# Patient Record
Sex: Female | Born: 1945
Health system: Southern US, Community
[De-identification: ages and names within clinical notes are randomized; demographics above are authoritative.]

## PROBLEM LIST (undated history)

## (undated) DIAGNOSIS — H409 Unspecified glaucoma: Secondary | ICD-10-CM

## (undated) DIAGNOSIS — T8859XA Other complications of anesthesia, initial encounter: Secondary | ICD-10-CM

## (undated) DIAGNOSIS — J449 Chronic obstructive pulmonary disease, unspecified: Secondary | ICD-10-CM

## (undated) DIAGNOSIS — J45909 Unspecified asthma, uncomplicated: Secondary | ICD-10-CM

## (undated) DIAGNOSIS — I10 Essential (primary) hypertension: Secondary | ICD-10-CM

## (undated) DIAGNOSIS — M1990 Unspecified osteoarthritis, unspecified site: Secondary | ICD-10-CM

## (undated) DIAGNOSIS — K760 Fatty (change of) liver, not elsewhere classified: Secondary | ICD-10-CM

## (undated) DIAGNOSIS — R011 Cardiac murmur, unspecified: Secondary | ICD-10-CM

## (undated) DIAGNOSIS — R0600 Dyspnea, unspecified: Secondary | ICD-10-CM

## (undated) HISTORY — PX: THORACOTOMY: SUR1349

## (undated) HISTORY — DX: Unspecified glaucoma: H40.9

## (undated) HISTORY — PX: OTHER SURGICAL HISTORY: SHX169

## (undated) HISTORY — PX: CHOLECYSTECTOMY: SHX55

## (undated) HISTORY — PX: DILATION AND CURETTAGE OF UTERUS: SHX78

---

## 1998-04-27 DIAGNOSIS — Z8614 Personal history of Methicillin resistant Staphylococcus aureus infection: Secondary | ICD-10-CM

## 1998-04-27 HISTORY — DX: Personal history of Methicillin resistant Staphylococcus aureus infection: Z86.14

## 2003-09-21 ENCOUNTER — Other Ambulatory Visit: Payer: Self-pay

## 2004-10-17 ENCOUNTER — Ambulatory Visit: Payer: Self-pay | Admitting: Internal Medicine

## 2005-05-14 ENCOUNTER — Ambulatory Visit: Payer: Self-pay | Admitting: Chiropractic Medicine

## 2005-09-02 ENCOUNTER — Other Ambulatory Visit: Payer: Self-pay

## 2005-09-08 ENCOUNTER — Ambulatory Visit: Payer: Self-pay | Admitting: Orthopaedic Surgery

## 2005-12-23 ENCOUNTER — Ambulatory Visit: Payer: Self-pay | Admitting: Orthopaedic Surgery

## 2006-02-03 ENCOUNTER — Ambulatory Visit: Payer: Self-pay | Admitting: Internal Medicine

## 2007-01-24 ENCOUNTER — Ambulatory Visit: Payer: Self-pay | Admitting: Internal Medicine

## 2008-03-15 ENCOUNTER — Ambulatory Visit: Payer: Self-pay | Admitting: Internal Medicine

## 2009-03-20 ENCOUNTER — Ambulatory Visit: Payer: Self-pay | Admitting: Internal Medicine

## 2010-03-21 ENCOUNTER — Ambulatory Visit: Payer: Self-pay | Admitting: Internal Medicine

## 2010-10-28 ENCOUNTER — Ambulatory Visit: Payer: Self-pay | Admitting: Rheumatology

## 2011-07-28 ENCOUNTER — Ambulatory Visit: Payer: Self-pay | Admitting: Internal Medicine

## 2011-08-05 ENCOUNTER — Ambulatory Visit: Payer: Self-pay | Admitting: Internal Medicine

## 2011-08-27 ENCOUNTER — Ambulatory Visit: Payer: Self-pay | Admitting: Specialist

## 2011-09-03 ENCOUNTER — Ambulatory Visit: Payer: Self-pay | Admitting: Cardiothoracic Surgery

## 2011-09-26 ENCOUNTER — Ambulatory Visit: Payer: Self-pay | Admitting: Cardiothoracic Surgery

## 2011-10-15 LAB — CREATININE, SERUM
Creatinine: 0.81 mg/dL (ref 0.60–1.30)
EGFR (African American): >60
EGFR (Non-African Amer.): >60

## 2011-10-26 ENCOUNTER — Ambulatory Visit: Payer: Self-pay | Admitting: Cardiothoracic Surgery

## 2012-01-14 ENCOUNTER — Ambulatory Visit: Payer: Self-pay | Admitting: Cardiothoracic Surgery

## 2012-01-26 ENCOUNTER — Ambulatory Visit: Payer: Self-pay | Admitting: Cardiothoracic Surgery

## 2012-02-01 ENCOUNTER — Ambulatory Visit: Payer: Self-pay | Admitting: Obstetrics and Gynecology

## 2012-02-01 LAB — BASIC METABOLIC PANEL
Anion Gap: 7 (ref 7–16)
Calcium, Total: 9.7 mg/dL (ref 8.5–10.1)
Co2: 30 mmol/L (ref 21–32)
Creatinine: 0.7 mg/dL (ref 0.60–1.30)
EGFR (African American): >60
Potassium: 3.4 mmol/L — ABNORMAL LOW (ref 3.5–5.1)
Sodium: 141 mmol/L (ref 136–145)

## 2012-02-12 ENCOUNTER — Ambulatory Visit: Payer: Self-pay | Admitting: Obstetrics and Gynecology

## 2012-04-11 ENCOUNTER — Ambulatory Visit: Payer: Self-pay | Admitting: Cardiothoracic Surgery

## 2012-04-12 ENCOUNTER — Ambulatory Visit: Payer: Self-pay | Admitting: Cardiothoracic Surgery

## 2012-05-05 ENCOUNTER — Ambulatory Visit: Payer: Self-pay | Admitting: Cardiothoracic Surgery

## 2012-05-17 LAB — CBC WITH DIFFERENTIAL/PLATELET
Eosinophil %: 1.9 %
HCT: 45.7 % (ref 35.0–47.0)
HGB: 15.4 g/dL (ref 12.0–16.0)
Lymphocyte #: 2.9 10*3/uL (ref 1.0–3.6)
Lymphocyte %: 30.2 %
MCH: 30.3 pg (ref 26.0–34.0)
MCHC: 33.7 g/dL (ref 32.0–36.0)
MCV: 90 fL (ref 80–100)
Monocyte #: 0.6 x10 3/mm (ref 0.2–0.9)
Monocyte %: 6.1 %
Neutrophil #: 5.8 10*3/uL (ref 1.4–6.5)
Neutrophil %: 61 %
Platelet: 253 10*3/uL (ref 150–440)
RBC: 5.09 10*6/uL (ref 3.80–5.20)
RDW: 14.1 % (ref 11.5–14.5)
WBC: 9.5 10*3/uL (ref 3.6–11.0)

## 2012-05-17 LAB — APTT: Activated PTT: 27.3 secs (ref 23.6–35.9)

## 2012-05-17 LAB — COMPREHENSIVE METABOLIC PANEL
Albumin: 3.9 g/dL (ref 3.4–5.0)
Alkaline Phosphatase: 75 U/L (ref 50–136)
Anion Gap: 5 — ABNORMAL LOW (ref 7–16)
Bilirubin,Total: 0.3 mg/dL (ref 0.2–1.0)
Creatinine: 1.1 mg/dL (ref 0.60–1.30)
EGFR (African American): >60
Osmolality: 281 (ref 275–301)
Potassium: 3.4 mmol/L — ABNORMAL LOW (ref 3.5–5.1)
SGPT (ALT): 54 U/L (ref 12–78)
Sodium: 140 mmol/L (ref 136–145)
Total Protein: 7.1 g/dL (ref 6.4–8.2)

## 2012-05-17 LAB — PROTIME-INR: INR: 1

## 2012-05-23 ENCOUNTER — Ambulatory Visit: Payer: Self-pay

## 2012-05-23 DIAGNOSIS — R0602 Shortness of breath: Secondary | ICD-10-CM

## 2012-05-30 ENCOUNTER — Inpatient Hospital Stay: Payer: Self-pay

## 2012-05-31 LAB — CBC WITH DIFFERENTIAL/PLATELET
Basophil #: 0 10*3/uL (ref 0.0–0.1)
Eosinophil %: 0.1 %
HCT: 39.1 % (ref 35.0–47.0)
Lymphocyte #: 1.7 10*3/uL (ref 1.0–3.6)
MCH: 30.4 pg (ref 26.0–34.0)
Monocyte #: 1.7 x10 3/mm — ABNORMAL HIGH (ref 0.2–0.9)
Monocyte %: 9.6 %
Neutrophil #: 13.9 10*3/uL — ABNORMAL HIGH (ref 1.4–6.5)
Neutrophil %: 80.1 %
Platelet: 232 10*3/uL (ref 150–440)
RBC: 4.25 10*6/uL (ref 3.80–5.20)
RDW: 13.8 % (ref 11.5–14.5)
WBC: 17.4 10*3/uL — ABNORMAL HIGH (ref 3.6–11.0)

## 2012-05-31 LAB — BASIC METABOLIC PANEL
Anion Gap: 6 — ABNORMAL LOW (ref 7–16)
BUN: 12 mg/dL (ref 7–18)
Chloride: 110 mmol/L — ABNORMAL HIGH (ref 98–107)
Co2: 25 mmol/L (ref 21–32)
Glucose: 150 mg/dL — ABNORMAL HIGH (ref 65–99)
Osmolality: 284 (ref 275–301)
Potassium: 4.2 mmol/L (ref 3.5–5.1)
Sodium: 141 mmol/L (ref 136–145)

## 2012-06-01 LAB — CBC WITH DIFFERENTIAL/PLATELET
Eosinophil #: 0.2 10*3/uL (ref 0.0–0.7)
Lymphocyte #: 2.2 10*3/uL (ref 1.0–3.6)
MCH: 30 pg (ref 26.0–34.0)
MCHC: 32.7 g/dL (ref 32.0–36.0)
Monocyte #: 1.3 x10 3/mm — ABNORMAL HIGH (ref 0.2–0.9)
Monocyte %: 9.4 %
Neutrophil #: 10.5 10*3/uL — ABNORMAL HIGH (ref 1.4–6.5)
RBC: 4.63 10*6/uL (ref 3.80–5.20)
RDW: 13.9 % (ref 11.5–14.5)

## 2012-06-01 LAB — BASIC METABOLIC PANEL
BUN: 12 mg/dL (ref 7–18)
Calcium, Total: 8.5 mg/dL (ref 8.5–10.1)
Co2: 28 mmol/L (ref 21–32)
Creatinine: 0.77 mg/dL (ref 0.60–1.30)
EGFR (Non-African Amer.): >60
Glucose: 122 mg/dL — ABNORMAL HIGH (ref 65–99)
Osmolality: 275 (ref 275–301)
Potassium: 3.8 mmol/L (ref 3.5–5.1)

## 2012-06-04 ENCOUNTER — Ambulatory Visit: Payer: Self-pay | Admitting: Cardiothoracic Surgery

## 2012-06-25 ENCOUNTER — Ambulatory Visit: Payer: Self-pay | Admitting: Cardiothoracic Surgery

## 2012-07-11 ENCOUNTER — Ambulatory Visit: Payer: Self-pay | Admitting: Cardiothoracic Surgery

## 2012-12-06 ENCOUNTER — Ambulatory Visit: Payer: Self-pay | Admitting: Internal Medicine

## 2013-09-27 IMAGING — CR DG CHEST 1V PORT
1 series · 1 of 1 positions shown · non-contrast
Comparison: none

REASON FOR EXAM: assess for pleural effusion
COMMENTS:

[ap]
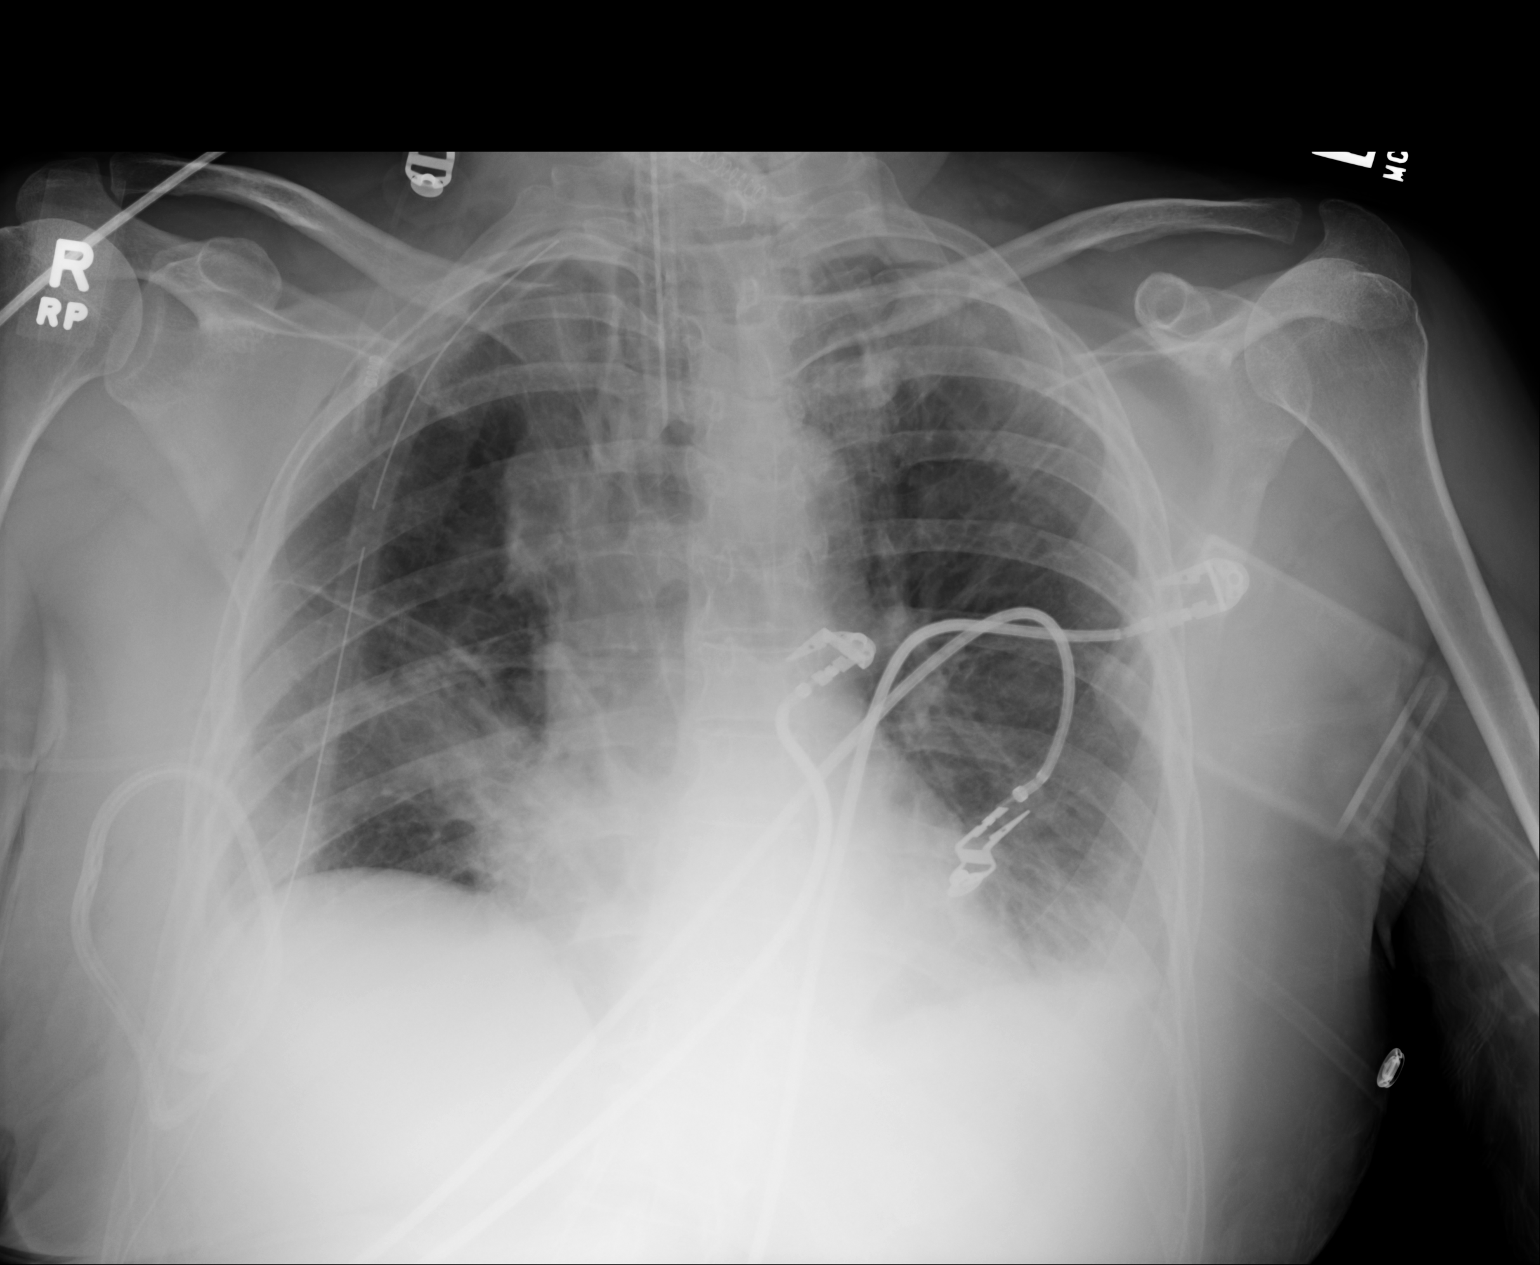

[1 of 1 positions shown; findings below may reference images not displayed]

PROCEDURE:     DXR - DXR PORTABLE CHEST SINGLE VIEW  - May 30, 2012 [DATE]

RESULT:     Comparison is made to study May 17, 2012.

There is obscuration of the left hemidiaphragm consistent with a small
amount of fluid and adjacent atelectasis or infiltrate. No large effusion is
demonstrated on the left nor on the right. No pneumothorax on the right is
demonstrated. There is an endotracheal tube in place whose tip lies
approximately 5 mm below the inferior margin of the clavicular heads. A
chest tube is in place on the right with the tip of the catheter in the
pulmonary apex. There is also a rounded tubular structure which may reflect
a small calibered chest tube is centered over the lateral costophrenic
gutter. There is atelectasis in the right middle lobe.
IMPRESSION: 1. No large pleural effusion is demonstrated. A small amount of fluid on the
left is suspected.
2. On the right there is minimal lobe atelectasis.
3. Positioning of the chest tubes and of the endotracheal tubes is as
described.

[REDACTED]

## 2013-09-29 ENCOUNTER — Ambulatory Visit: Payer: Self-pay | Admitting: Ophthalmology

## 2013-09-29 IMAGING — CR DG CHEST 1V PORT
1 series · 1 of 1 positions shown · non-contrast
Comparison: none

REASON FOR EXAM: temp
COMMENTS:

PROCEDURE:     DXR - DXR PORTABLE CHEST SINGLE VIEW  - June 01, 2012  [DATE]
RESULT:     Comparison: 05/30/2012

[ap]
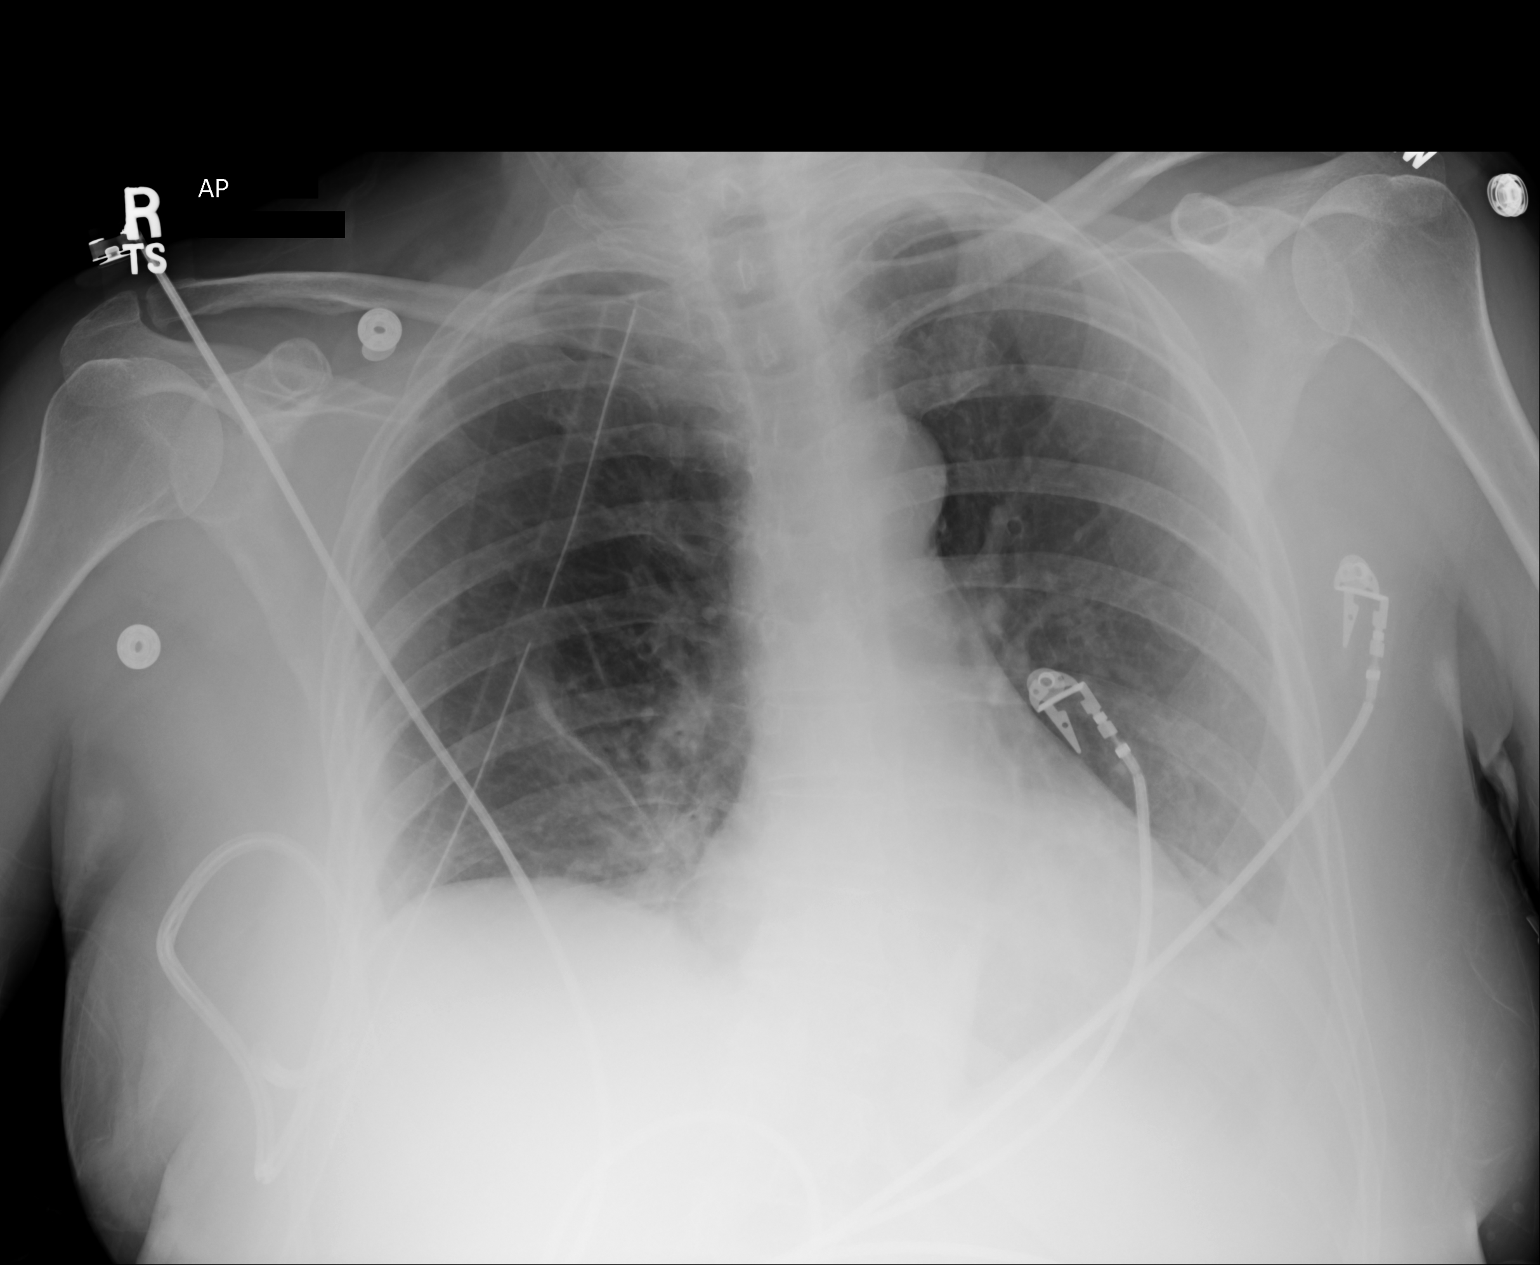

[1 of 1 positions shown; findings below may reference images not displayed]

FINDINGS: Single portable AP chest radiograph is provided. There is a right-sided
chest tube present in satisfactory position. There is no pneumothorax. There
is no right pleural effusion. There is a trace left pleural effusion. Stable
heart and mediastinum. The osseous structures are unremarkable.
IMPRESSION: Please see above.

[REDACTED]

## 2013-09-30 IMAGING — CR DG CHEST 2V
1 series · 2 of 2 positions shown · non-contrast
Comparison: none

REASON FOR EXAM: assess for pleural effusion
COMMENTS:

PROCEDURE:     DXR - DXR CHEST PA (OR AP) AND LATERAL  - June 02, 2012 [DATE]
RESULT:     Comparison: 06/02/2012

[Series 1: ap · 0.17mm/px · 2 of 2 slices shown]
[im 1/2]
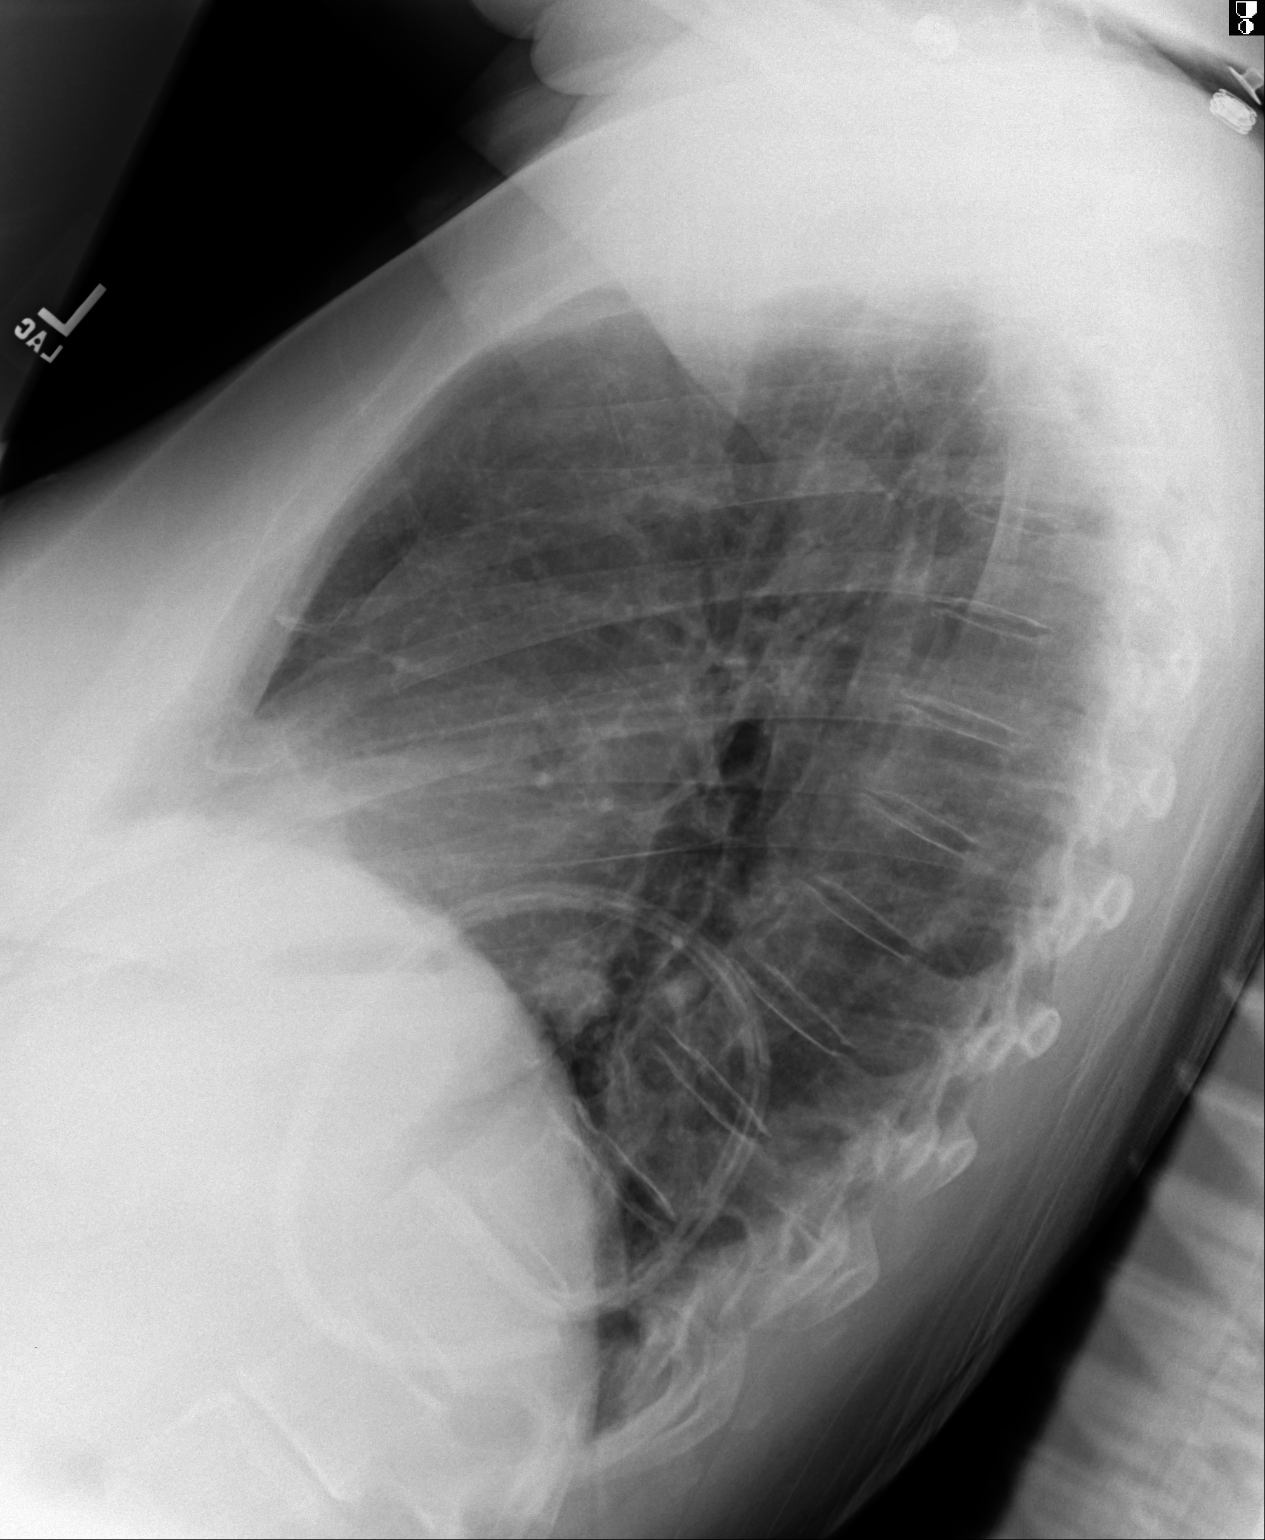
[im 2/2]
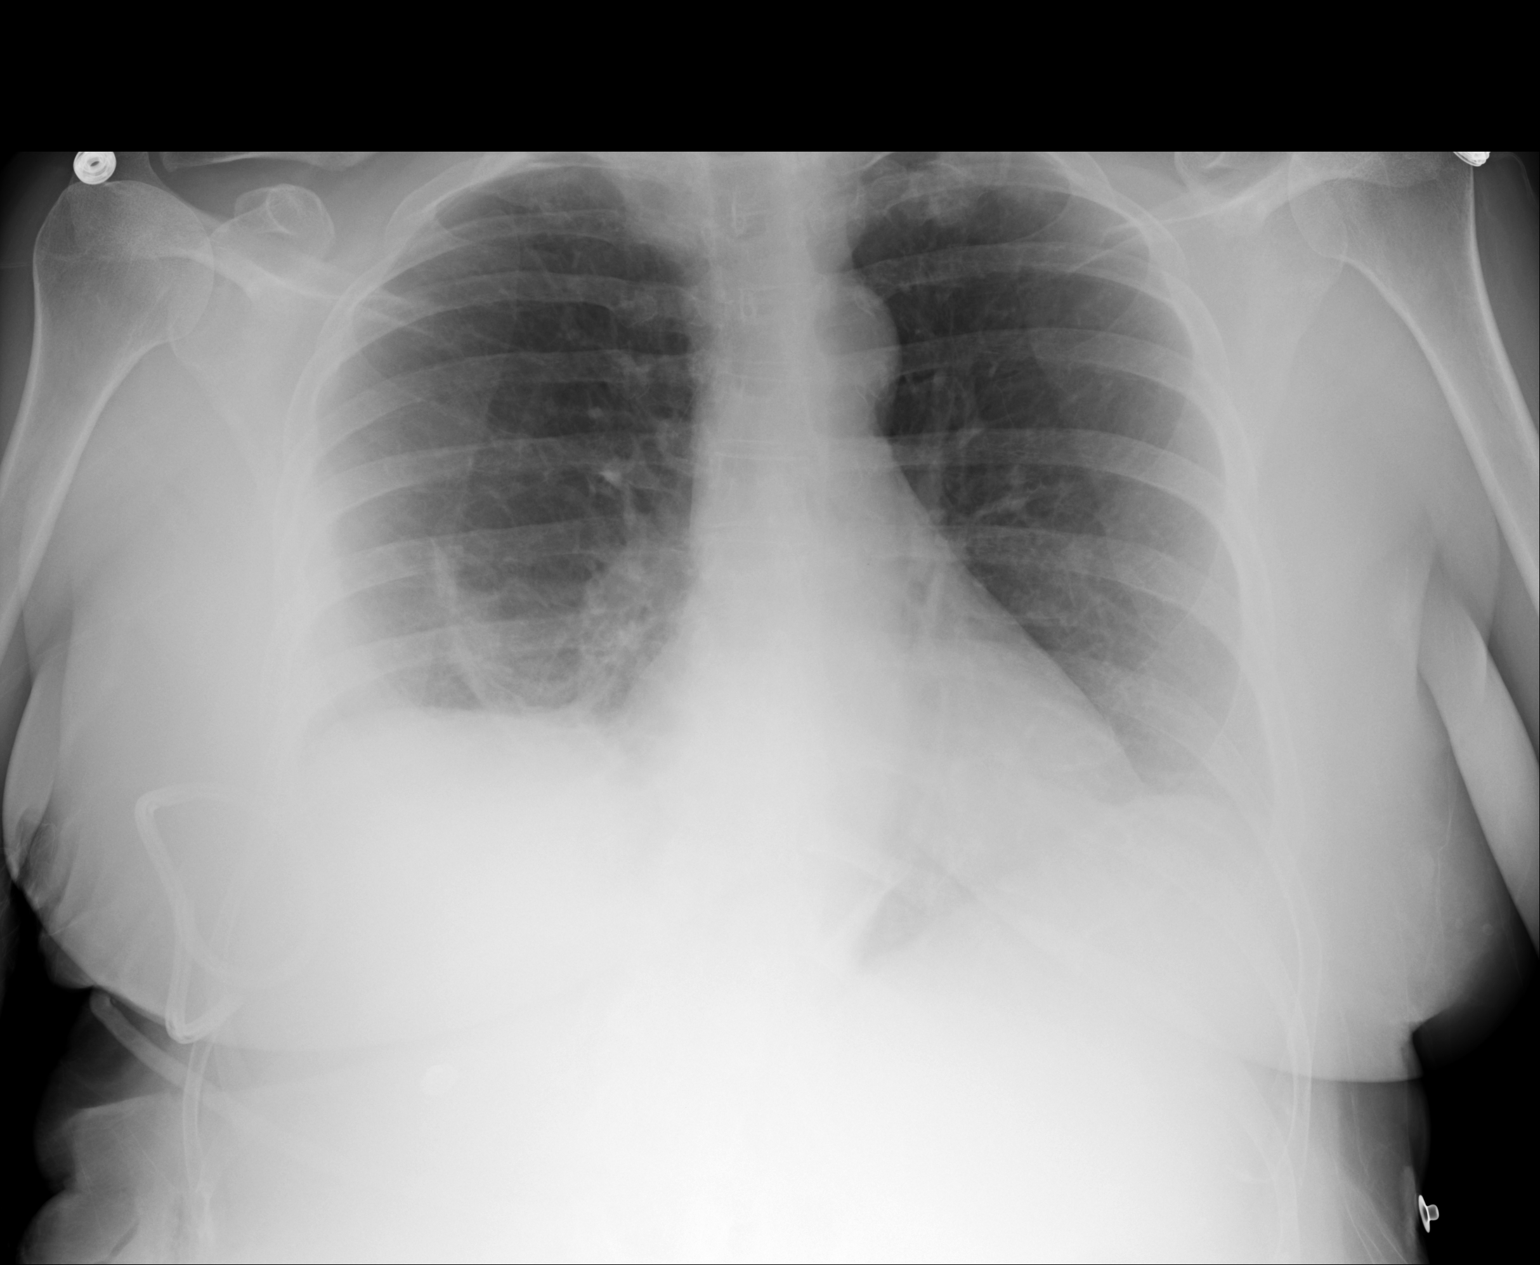

[2 of 2 positions shown; findings below may reference images not displayed]

FINDINGS: The heart and mediastinum are stable. The right chest tube has been removed.
Trace right apical pneumothorax is similar to prior. Mild right basilar
opacities are likely secondary to atelectasis.
IMPRESSION: Trace right pneumothorax is similar to prior, status post right chest tube
removal.

[REDACTED]

## 2013-09-30 IMAGING — CR DG CHEST 2V
1 series · 3 of 3 positions shown · non-contrast
Comparison: none

REASON FOR EXAM: assess for pleural effusion
COMMENTS:

PROCEDURE:     DXR - DXR CHEST PA (OR AP) AND LATERAL  - June 02, 2012  [DATE]
RESULT:     Comparison: 06/01/2012

[Series 1: x chest ap · 0.14mm/px · 3 of 3 slices shown]
[im 1/3]
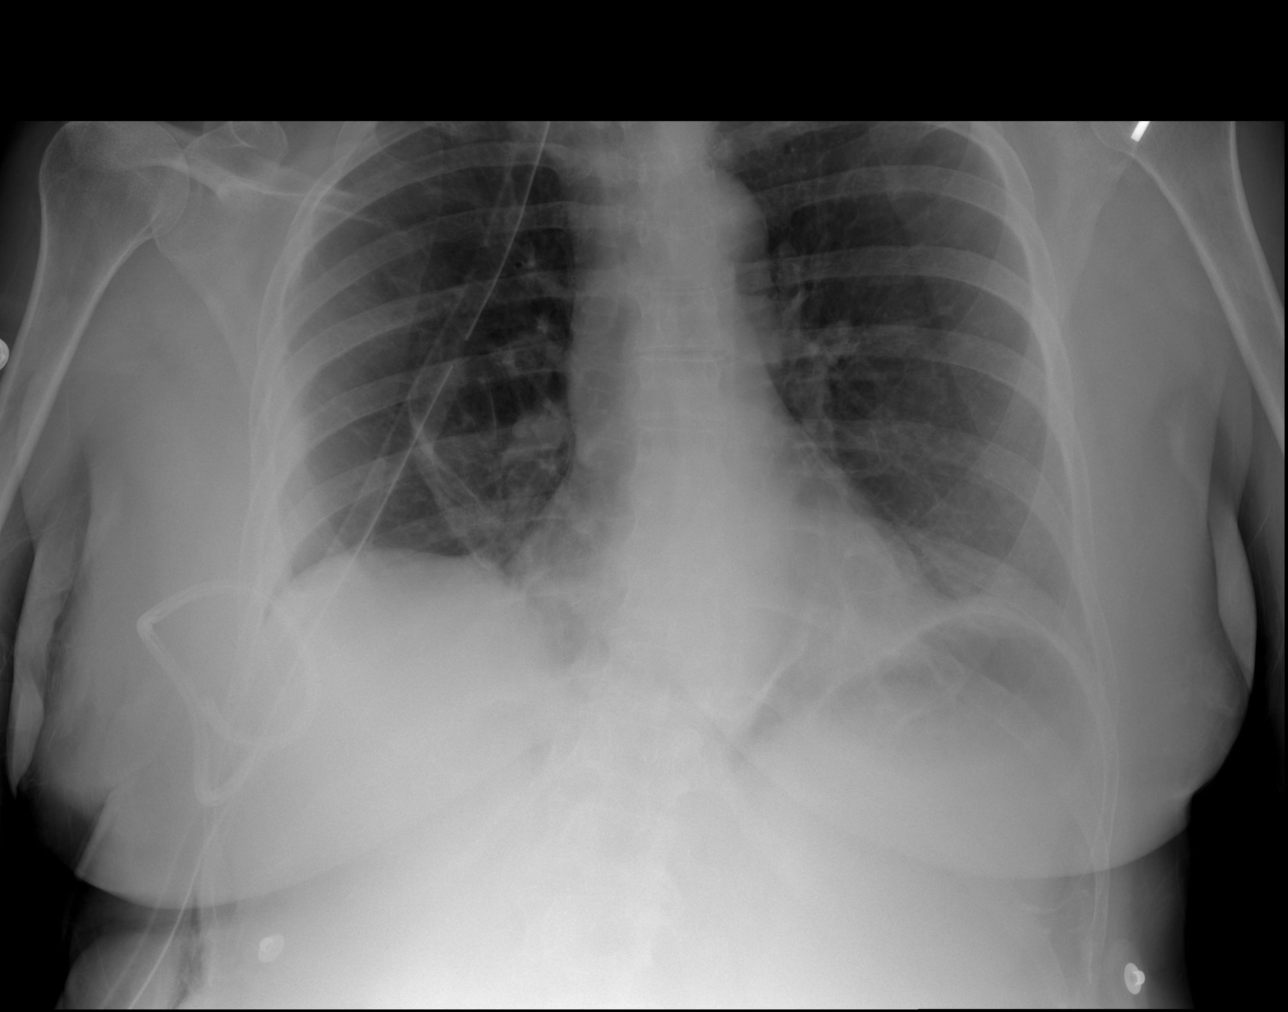
[im 2/3]
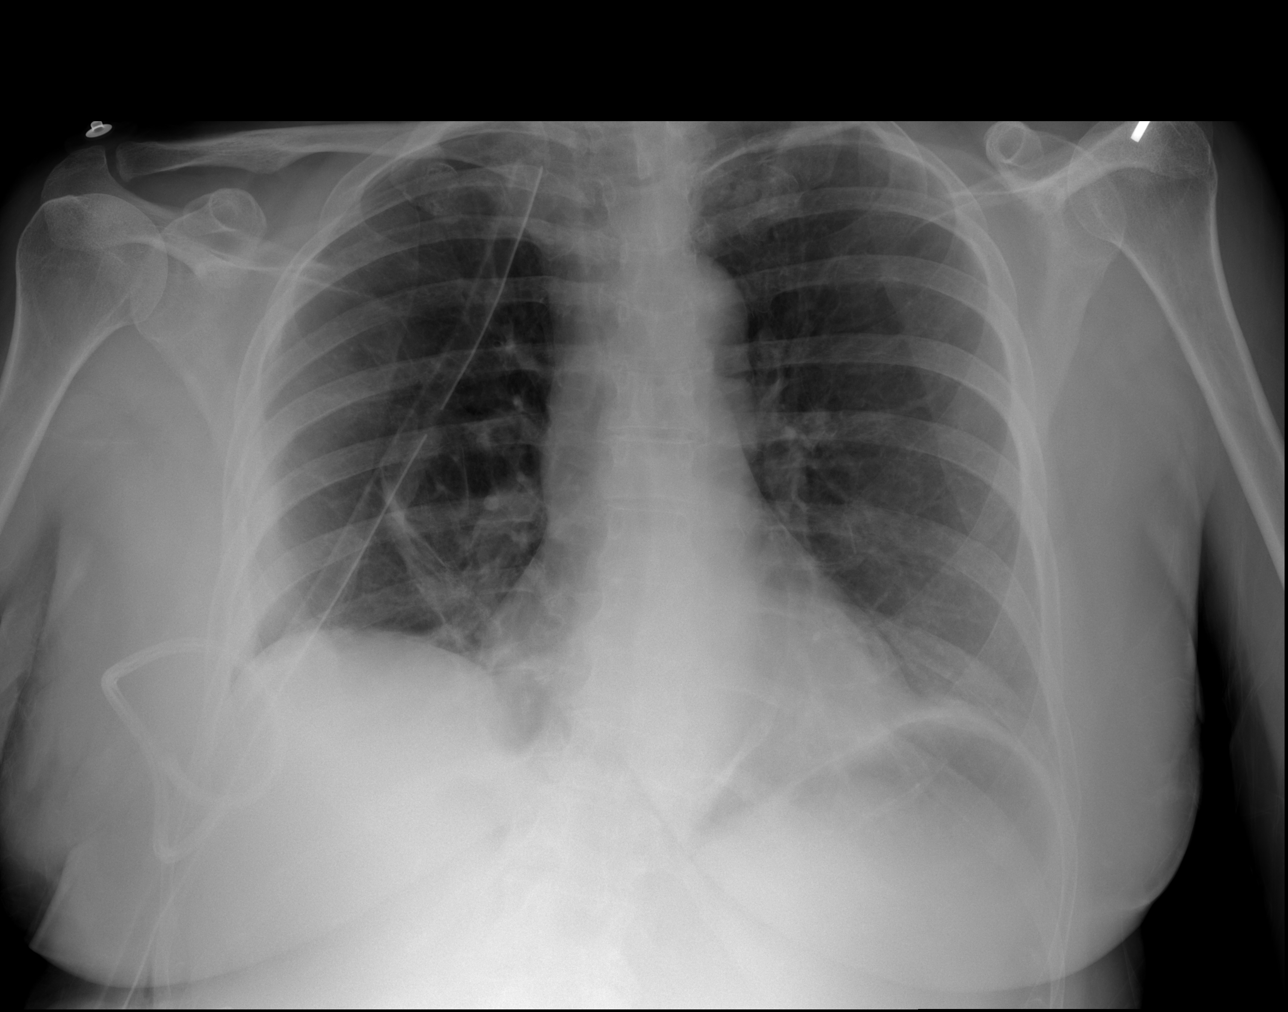
[im 3/3]
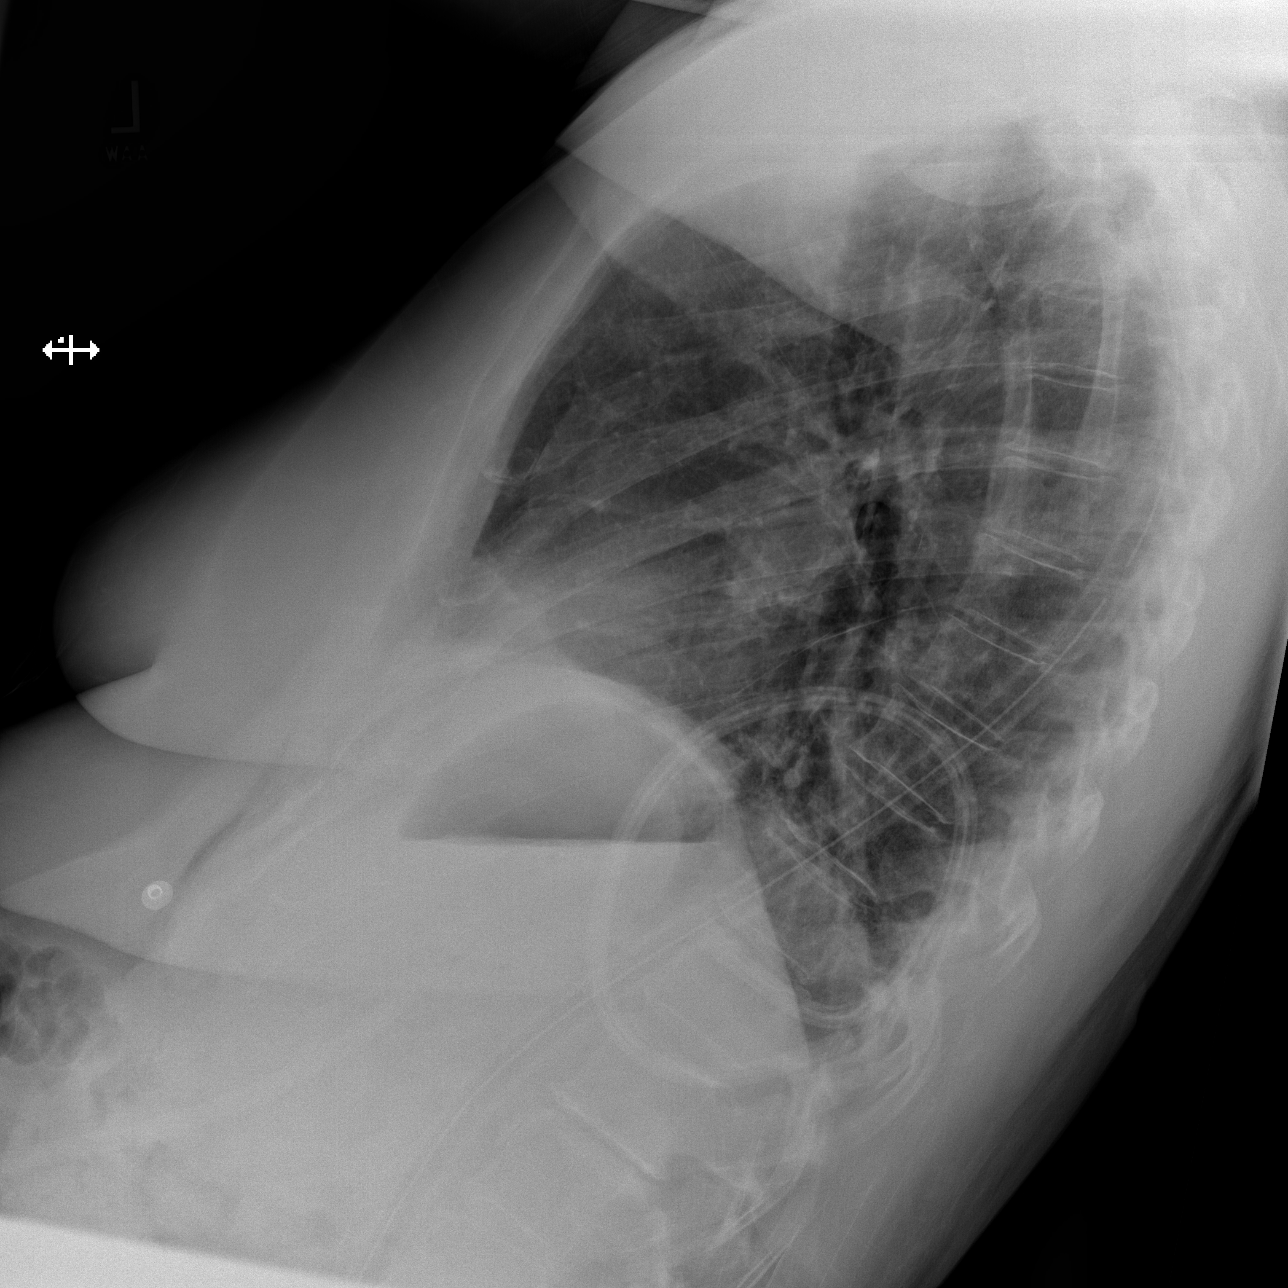

[3 of 3 positions shown; findings below may reference images not displayed]

FINDINGS: The heart and mediastinum are stable. Right-sided chest tube remains in
place. There is a trace right apical pneumothorax which is likely similar to
prior. Mild right basilar opacities are likely secondary to atelectasis.
IMPRESSION: Residual trace right apical pneumothorax, with chest tube in place.

[REDACTED]

## 2013-10-11 IMAGING — CR DG CHEST 2V
1 series · 2 of 2 positions shown · non-contrast
Comparison: none

REASON FOR EXAM: [DATE] Swelling Mass or Lump in Chest
COMMENTS:

[Series 1: pa · 0.17mm/px · 2 of 2 slices shown]
[im 1/2]
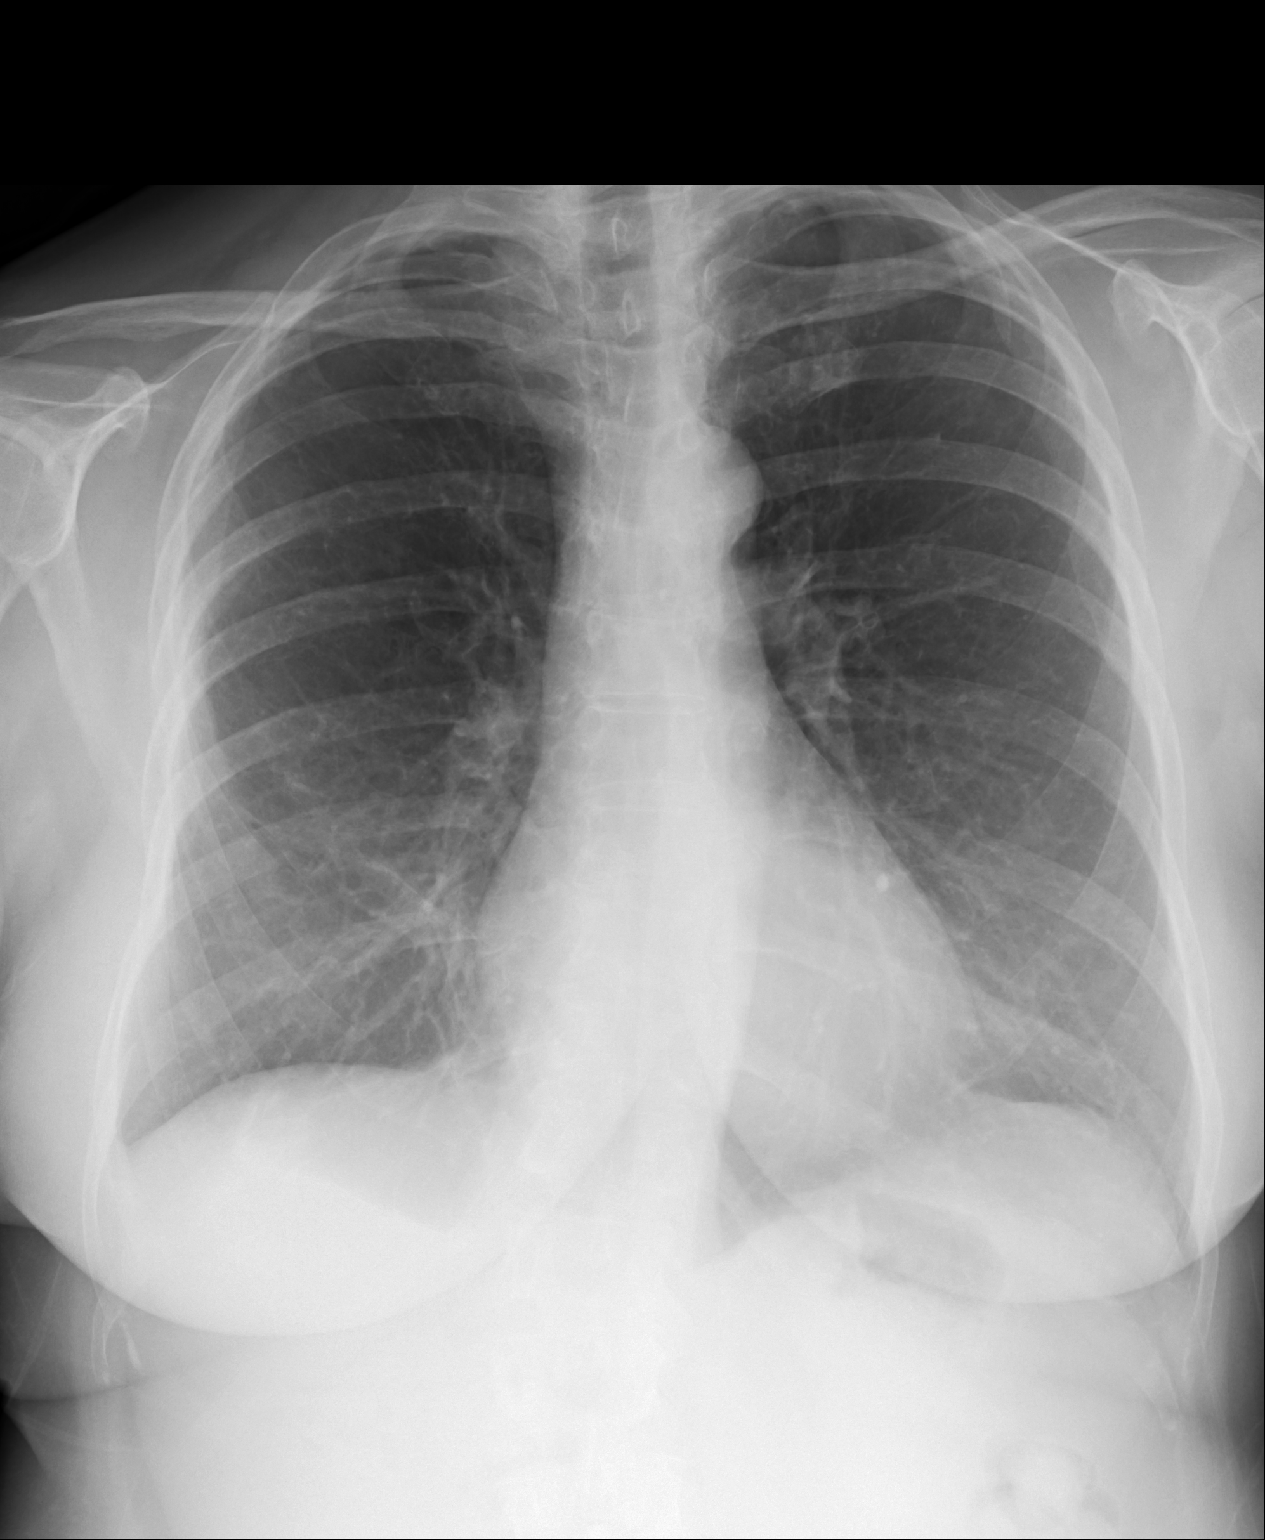
[im 2/2]
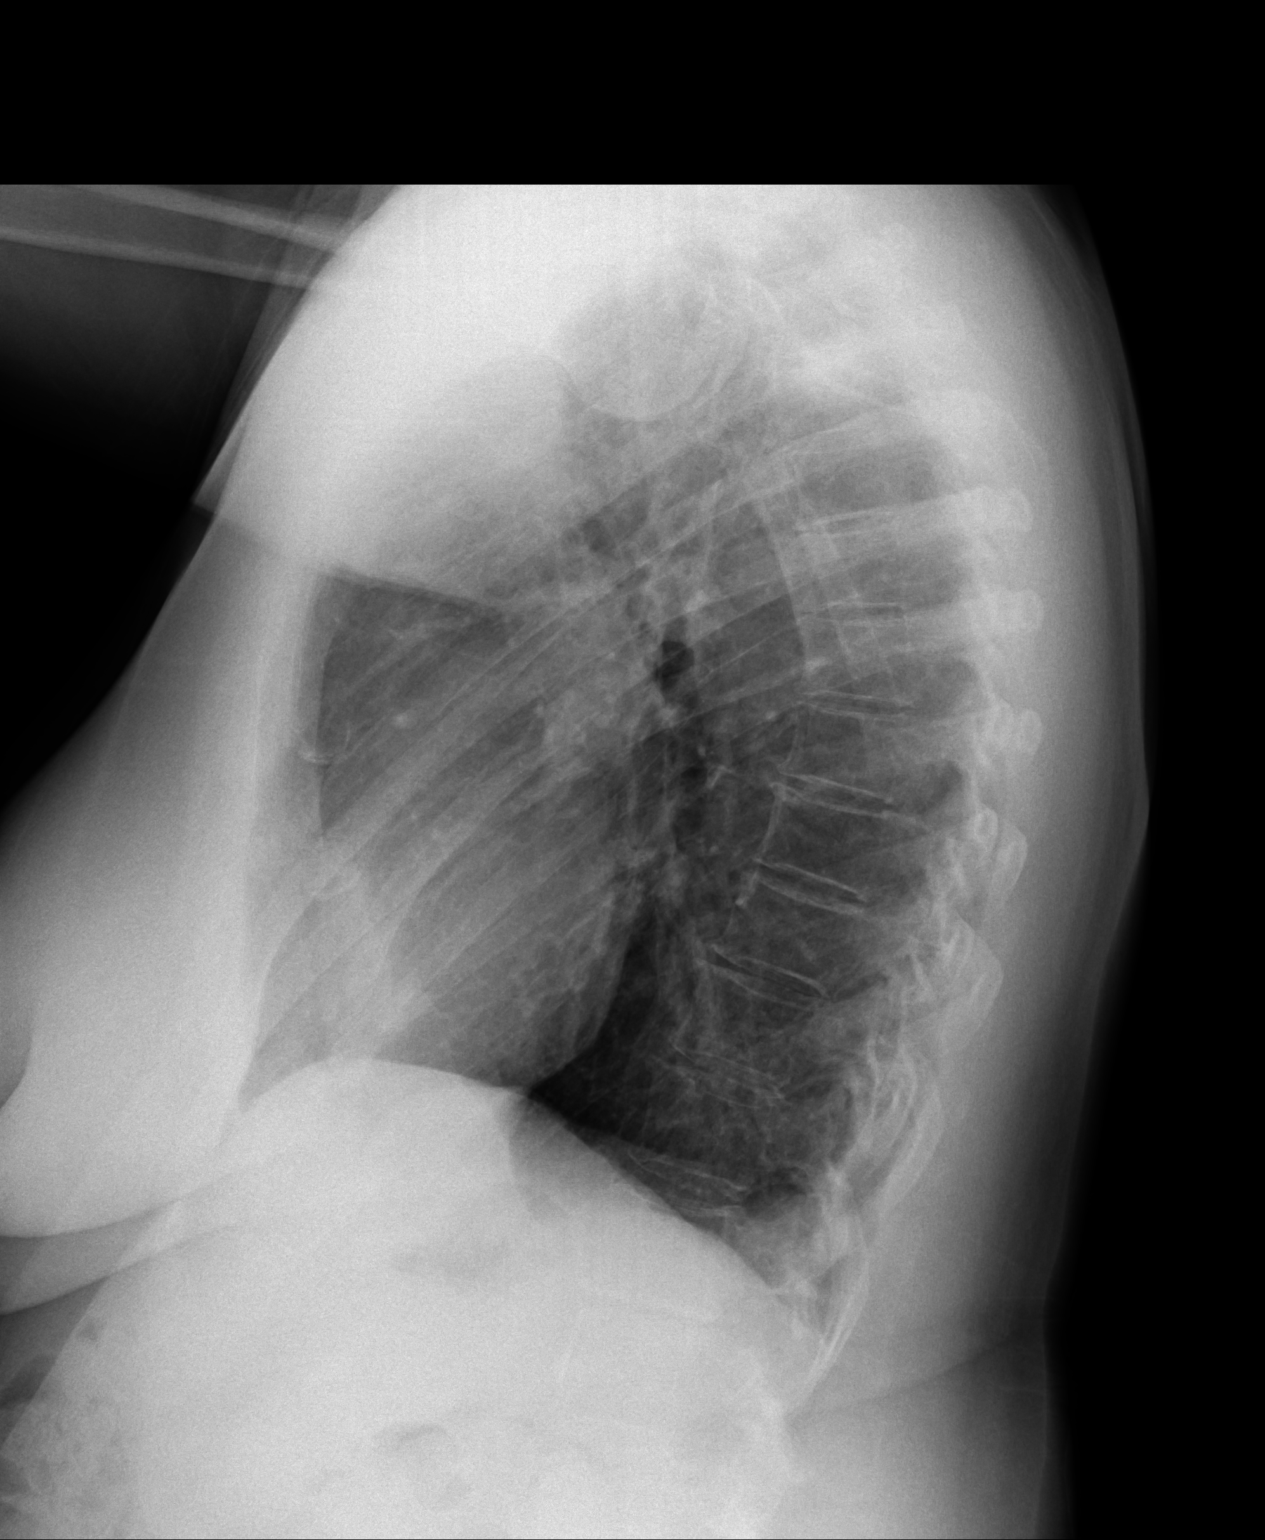

[2 of 2 positions shown; findings below may reference images not displayed]

PROCEDURE:     DXR - DXR CHEST PA (OR AP) AND LATERAL  - June 13, 2012  [DATE]

RESULT:     Comparison is made to the study 02 June, 2012.

The lungs are hyperinflated. The lungs are clear. The heart and pulmonary
vessels are normal. The bony and mediastinal structures are unremarkable.
There is no effusion. There is no pneumothorax or evidence of congestive
failure.
IMPRESSION: No acute cardiopulmonary disease. COPD.

[REDACTED]

## 2014-01-11 ENCOUNTER — Ambulatory Visit: Payer: Self-pay | Admitting: Internal Medicine

## 2014-08-17 NOTE — Discharge Summary (Signed)
PATIENT NAME:  Jasmine DikeCREWS, Lu R MR#:  [id number] DATE OF BIRTH:  [birth year]  DATE OF ADMISSION:  05/30/2012 DATE OF DISCHARGE:  06/03/2012  ADMITTING DIAGNOSIS: Right lower lobe mass.  DISCHARGE DIAGNOSIS: Right lower lobe mass (frozen section showing benign necrotic nodule).   HOSPITAL COURSE: The patient is a 69 year old white female who has had a PET positive right lower lobe mass followed for the last several months. This has not dissipated and she was offered a right thoracotomy and wedge resection for definitive diagnosis and treatment. She was brought into the hospital on February 3, where she underwent surgical intervention. During the procedure, the intraoperative frozen section returned a benign nodule. She recovered over the next several days and had her chest tube removed and she was discharged to home on February 7. At the time of discharge, she was instructed to return to see me in the office in 1 week. She was given a prescription for acetaminophen/oxycodone 325/7.5 tablets to take 1 every 4 hours as needed. In addition, she was instructed to continue her simvastatin 10 mg at bedtime, Evista 60 mg once a day in the morning, hydrochlorothiazide 12.5 mg once a day in the morning, multivitamins and calcium, as per her routine and enalapril 10 mg once per day. She was told to discontinue her aspirin until she was seen in the office. At the time of discharge, her wounds were healing as expected. Her chest x-ray looked fine after her chest tube was removed.     ____________________________ Sheppard Plumberimothy E. Thelma Bargeaks, MD teo:cc D: 06/20/2012 17:41:38 ET T: 06/20/2012 20:49:05 ET JOB#: [id number]  cc: Marcial Pacasimothy E. Thelma Bargeaks, MD, <Dictator> Jasmine DecemberIMOTHY E Tayten Bergdoll MD ELECTRONICALLY SIGNED 06/21/2012 13:30

## 2014-08-17 NOTE — Op Note (Signed)
PATIENT NAME:  Jasmine Sullivan, Jasmine Sullivan MR#:  [id number] DATE OF BIRTH:  1946/02/16  DATE OF PROCEDURE:  05/30/2012  PREOPERATIVE DIAGNOSIS: Right lower lobe mass.  POSTOPERATIVE DIAGNOSIS: Right lower lobe mass (frozen section showing benign nodule).   PROCEDURES PERFORMED:   1. Preoperative bronchoscopy to assess endobronchial anatomy.  2. Right muscle-sparing thoracotomy with wedge resection, right lower lobe mass.   SURGEON: Hulda Marinimothy Mina Carlisi, MD  ASSISTANT: Ida Roguehristopher Lundquist, MD / Tressia Minersarolyn York, [place] Student  INDICATIONS FOR PROCEDURE: Jasmine Sullivan is a 69 year old woman with a PET positive right lower lobe mass that has been persistent on several CT scans. She was apprised of the indications and risks of surgery and she gave her informed consent.   DESCRIPTION OF PROCEDURE: The patient was brought to the operating suite and placed in the supine position. General endotracheal anesthesia was given through a double-lumen tube. Preoperative bronchoscopy was carried out and was normal to the subsegmental level bilaterally. The patient was then positioned for right thoracoscopy. All pressure points were carefully padded. The patient was prepped and draped in the usual sterile fashion. A muscle-sparing fifth interspace thoracotomy was performed. The latissimus and serratus muscles were retracted anteriorly and posteriorly after raising skin flaps. The chest was entered. Palpation of the right lower lobe revealed a single dominant nodule measuring approximately 1 cm in size in its midportion posteriorly. There was no other pathology identified. Using multiple firings of the endoscopic stapler, the lesion was excised from the lung and sent for frozen section. Frozen section revealed a benign nodule in the lung. There was no evidence of granulomas. There was no evidence of malignancy. The staple line was hemostatic. The chest was irrigated and then closed over a 28 French chest tube, which was inserted through our prior  thoracoscopy port. This port was created to allow for passage of the stapler and for additional lighting. The chest was reapproximated with #2 Vicryl paracostal sutures. The muscles of the chest wall were allowed to return to their normal anatomic position. A Blake drain was inserted, in the subcutaneous space. The fascia was closed with 2-0 Vicryl and the skin with 3-0 Vicryl. The thoracoscopy port was closed with interrupted 0 Vicryl on the muscle and 2-0 on the subcutaneous tissues and nylon on the skin. The chest tube was secured with silk. The patient was then rolled in the supine position where she was taken to the recovery room in stable condition.  ___________________________ Sheppard Plumberimothy E. Thelma Bargeaks, MD teo:sb D: 05/30/2012 16:49:47 ET T: 05/31/2012 10:59:09 ET JOB#: [id number]  cc: Marcial Pacasimothy E. Thelma Bargeaks, MD, <Dictator> Jasmine DecemberIMOTHY E Shakeera Rightmyer MD ELECTRONICALLY SIGNED 06/07/2012 15:54

## 2015-10-28 ENCOUNTER — Other Ambulatory Visit: Payer: Self-pay | Admitting: Internal Medicine

## 2015-10-28 DIAGNOSIS — Z1231 Encounter for screening mammogram for malignant neoplasm of breast: Secondary | ICD-10-CM

## 2015-11-11 ENCOUNTER — Ambulatory Visit
Admission: RE | Admit: 2015-11-11 | Discharge: 2015-11-11 | Disposition: A | Discharge status: Home / Self Care | Payer: Medicare Other | Source: Ambulatory Visit | Attending: Internal Medicine | Admitting: Internal Medicine

## 2015-11-11 ENCOUNTER — Other Ambulatory Visit: Payer: Self-pay | Admitting: Internal Medicine

## 2015-11-11 DIAGNOSIS — Z1231 Encounter for screening mammogram for malignant neoplasm of breast: Secondary | ICD-10-CM | POA: Diagnosis not present

## 2016-10-23 ENCOUNTER — Other Ambulatory Visit: Payer: Self-pay | Admitting: Internal Medicine

## 2016-10-23 DIAGNOSIS — Z1231 Encounter for screening mammogram for malignant neoplasm of breast: Secondary | ICD-10-CM

## 2016-11-17 ENCOUNTER — Ambulatory Visit
Admission: RE | Admit: 2016-11-17 | Discharge: 2016-11-17 | Disposition: A | Discharge status: Home / Self Care | Payer: Medicare Other | Source: Ambulatory Visit | Attending: Internal Medicine | Admitting: Internal Medicine

## 2016-11-17 DIAGNOSIS — Z1231 Encounter for screening mammogram for malignant neoplasm of breast: Secondary | ICD-10-CM | POA: Insufficient documentation

## 2017-03-22 ENCOUNTER — Encounter: Payer: Self-pay | Admitting: *Deleted

## 2017-03-23 ENCOUNTER — Ambulatory Visit: Payer: Medicare Other | Admitting: Anesthesiology

## 2017-03-23 ENCOUNTER — Other Ambulatory Visit: Payer: Self-pay

## 2017-03-23 ENCOUNTER — Encounter
Admission: RE | Disposition: A | Discharge status: Home / Self Care | Payer: Self-pay | Source: Ambulatory Visit | Attending: Ophthalmology

## 2017-03-23 ENCOUNTER — Ambulatory Visit
Admission: RE | Admit: 2017-03-23 | Discharge: 2017-03-23 | Disposition: A | Discharge status: Home / Self Care | Payer: Medicare Other | Source: Ambulatory Visit | Attending: Ophthalmology | Admitting: Ophthalmology

## 2017-03-23 ENCOUNTER — Encounter: Payer: Self-pay | Admitting: Anesthesiology

## 2017-03-23 DIAGNOSIS — R0600 Dyspnea, unspecified: Secondary | ICD-10-CM | POA: Diagnosis not present

## 2017-03-23 DIAGNOSIS — Z87891 Personal history of nicotine dependence: Secondary | ICD-10-CM | POA: Diagnosis not present

## 2017-03-23 DIAGNOSIS — I10 Essential (primary) hypertension: Secondary | ICD-10-CM | POA: Diagnosis not present

## 2017-03-23 DIAGNOSIS — J449 Chronic obstructive pulmonary disease, unspecified: Secondary | ICD-10-CM | POA: Insufficient documentation

## 2017-03-23 DIAGNOSIS — H2511 Age-related nuclear cataract, right eye: Secondary | ICD-10-CM | POA: Diagnosis not present

## 2017-03-23 HISTORY — PX: CATARACT EXTRACTION W/PHACO: SHX586

## 2017-03-23 HISTORY — DX: Chronic obstructive pulmonary disease, unspecified: J44.9

## 2017-03-23 HISTORY — DX: Essential (primary) hypertension: I10

## 2017-03-23 HISTORY — DX: Dyspnea, unspecified: R06.00

## 2017-03-23 HISTORY — DX: Unspecified asthma, uncomplicated: J45.909

## 2017-03-23 HISTORY — DX: Cardiac murmur, unspecified: R01.1

## 2017-03-23 SURGERY — PHACOEMULSIFICATION, CATARACT, WITH IOL INSERTION
Anesthesia: Monitor Anesthesia Care | Site: Eye | Laterality: Right | Wound class: Clean

## 2017-03-23 MED ORDER — EPINEPHRINE PF 1 MG/ML IJ SOLN
INTRAOCULAR | Status: DC | PRN
  Administered 2017-03-23: 11:00:00 via OPHTHALMIC

## 2017-03-23 MED ORDER — ARMC OPHTHALMIC DILATING DROPS
OPHTHALMIC | Status: AC
  Administered 2017-03-23: 1 via OPHTHALMIC
  Filled 2017-03-23: qty 0.4

## 2017-03-23 MED ORDER — MOXIFLOXACIN HCL 0.5 % OP SOLN
OPHTHALMIC | Status: AC
  Filled 2017-03-23: qty 3

## 2017-03-23 MED ORDER — ARMC OPHTHALMIC DILATING DROPS
1.0000 "application " | OPHTHALMIC | Status: AC
  Administered 2017-03-23 (×3): 1 via OPHTHALMIC

## 2017-03-23 MED ORDER — POVIDONE-IODINE 5 % OP SOLN
OPHTHALMIC | Status: DC | PRN
  Administered 2017-03-23: 1 via OPHTHALMIC

## 2017-03-23 MED ORDER — CARBACHOL 0.01 % IO SOLN
INTRAOCULAR | Status: DC | PRN
  Administered 2017-03-23: 0.5 mL via INTRAOCULAR

## 2017-03-23 MED ORDER — FENTANYL CITRATE (PF) 100 MCG/2ML IJ SOLN
INTRAMUSCULAR | Status: AC
  Filled 2017-03-23: qty 2

## 2017-03-23 MED ORDER — FENTANYL CITRATE (PF) 100 MCG/2ML IJ SOLN
INTRAMUSCULAR | Status: DC | PRN
  Administered 2017-03-23 (×2): 50 ug via INTRAVENOUS

## 2017-03-23 MED ORDER — MOXIFLOXACIN HCL 0.5 % OP SOLN: 1.0000 [drp] | OPHTHALMIC | Status: DC | PRN

## 2017-03-23 MED ORDER — NA CHONDROIT SULF-NA HYALURON 40-17 MG/ML IO SOLN
INTRAOCULAR | Status: DC | PRN
  Administered 2017-03-23: 1 mL via INTRAOCULAR

## 2017-03-23 MED ORDER — LIDOCAINE HCL (PF) 4 % IJ SOLN
INTRAMUSCULAR | Status: DC | PRN
  Administered 2017-03-23: 4 mL via OPHTHALMIC

## 2017-03-23 MED ORDER — MOXIFLOXACIN HCL 0.5 % OP SOLN
OPHTHALMIC | Status: DC | PRN
  Administered 2017-03-23: 0.2 mL via OPHTHALMIC

## 2017-03-23 MED ORDER — SODIUM CHLORIDE 0.9 % IV SOLN
INTRAVENOUS | Status: DC
  Administered 2017-03-23: 11:00:00 via INTRAVENOUS

## 2017-03-23 SURGICAL SUPPLY — 16 items
GLOVE BIO SURGEON STRL SZ8 (GLOVE) ×3 IMPLANT
GLOVE BIOGEL M 6.5 STRL (GLOVE) ×3 IMPLANT
GLOVE SURG LX 8.0 MICRO (GLOVE) ×2
GLOVE SURG LX STRL 8.0 MICRO (GLOVE) ×1 IMPLANT
GOWN STRL REUS W/ TWL LRG LVL3 (GOWN DISPOSABLE) ×2 IMPLANT
GOWN STRL REUS W/TWL LRG LVL3 (GOWN DISPOSABLE) ×4
LABEL CATARACT MEDS ST (LABEL) ×3 IMPLANT
LENS IOL TECNIS ITEC 21.0 (Intraocular Lens) ×3 IMPLANT
PACK CATARACT (MISCELLANEOUS) ×3 IMPLANT
PACK CATARACT BRASINGTON LX (MISCELLANEOUS) ×3 IMPLANT
PACK EYE AFTER SURG (MISCELLANEOUS) ×3 IMPLANT
SOL BSS BAG (MISCELLANEOUS) ×3
SOLUTION BSS BAG (MISCELLANEOUS) ×1 IMPLANT
SYR 5ML LL (SYRINGE) ×3 IMPLANT
WATER STERILE IRR 250ML POUR (IV SOLUTION) ×3 IMPLANT
WIPE NON LINTING 3.25X3.25 (MISCELLANEOUS) ×3 IMPLANT

## 2017-03-23 NOTE — Anesthesia Post-op Follow-up Note (Signed)
Anesthesia QCDR form completed.        

## 2017-03-23 NOTE — Anesthesia Postprocedure Evaluation (Signed)
Anesthesia Post Note  Patient: Jasmine Sullivan  Procedure(s) Performed: CATARACT EXTRACTION PHACO AND INTRAOCULAR LENS PLACEMENT ([place]) (Right Eye)  Patient location during evaluation: Short Stay Anesthesia Type: MAC Level of consciousness: awake and alert and oriented Pain management: pain level controlled Vital Signs Assessment: post-procedure vital signs reviewed and stable Respiratory status: spontaneous breathing and respiratory function stable Cardiovascular status: stable Postop Assessment: no apparent nausea or vomiting Anesthetic complications: no     Last Vitals:  Vitals:   03/23/17 0955 03/23/17 1136  BP: 136/63 (!) 170/67  Pulse: 73 65  Resp: 16 16  Temp: (!) 36.1 C 36.6 C  SpO2: 99% 100%    Last Pain:  Vitals:   03/23/17 0955  TempSrc: Tympanic                 Nadene Rubins

## 2017-03-23 NOTE — Op Note (Signed)
PREOPERATIVE DIAGNOSIS:  Nuclear sclerotic cataract of the right eye.   POSTOPERATIVE DIAGNOSIS:  NUCLEAR SCLEROTIC CATARACT RIGHT EYE   OPERATIVE PROCEDURE: Procedure(s): CATARACT EXTRACTION PHACO AND INTRAOCULAR LENS PLACEMENT ([facility])   SURGEON:  Galen ManilaWilliam Randy Castrejon, MD.   ANESTHESIA:  Anesthesiologist: Lenard SimmerKarenz, Andrew, MD CRNA: Lily KocherPeralta, Nicole, CRNA; Omer JackWeatherly, Janice, CRNA  1.      Managed anesthesia care. 2.      0.381ml of Shugarcaine was instilled in the eye following the paracentesis.   COMPLICATIONS:  None.   TECHNIQUE:   Stop and chop   DESCRIPTION OF PROCEDURE:  The patient was examined and consented in the preoperative holding area where the aforementioned topical anesthesia was applied to the right eye and then brought back to the Operating Room where the right eye was prepped and draped in the usual sterile ophthalmic fashion and a lid speculum was placed. A paracentesis was created with the side port blade and the anterior chamber was filled with viscoelastic. A near clear corneal incision was performed with the steel keratome. A continuous curvilinear capsulorrhexis was performed with a cystotome followed by the capsulorrhexis forceps. Hydrodissection and hydrodelineation were carried out with BSS on a blunt cannula. The lens was removed in a stop and chop  technique and the remaining cortical material was removed with the irrigation-aspiration handpiece. The capsular bag was inflated with viscoelastic and the Technis ZCB00  lens was placed in the capsular bag without complication. The remaining viscoelastic was removed from the eye with the irrigation-aspiration handpiece. The wounds were hydrated. The anterior chamber was flushed with Miostat and the eye was inflated to physiologic pressure. 0.11ml of Vigamox was placed in the anterior chamber. The wounds were found to be water tight. The eye was dressed with Vigamox. The patient was given protective glasses to wear throughout the day  and a shield with which to sleep tonight. The patient was also given drops with which to begin a drop regimen today and will follow-up with me in one day. Implant Name Type Inv. Item Serial No. Manufacturer Lot No. LRB No. Used  LENS IOL DIOP 21.0 - [id number] Intraocular Lens LENS IOL DIOP 21.0 [id number] AMO  Right 1   Procedure(s) with comments: CATARACT EXTRACTION PHACO AND INTRAOCULAR LENS PLACEMENT ([facility]) (Right) - US 00:36.7 AP% 13.2 CDE 4.84 Fluid Pack lot # [id number]  Electronically signed: Emalee Knies LOUIS 03/23/2017 11:36 AM

## 2017-03-23 NOTE — H&P (Signed)
All labs reviewed. Abnormal studies sent to patients PCP when indicated.  Previous H&P reviewed, patient examined, there are NO CHANGES.  Jasmine Sullivan LOUIS11/27/201811:10 AM

## 2017-03-23 NOTE — Transfer of Care (Signed)
Immediate Anesthesia Transfer of Care Note  Patient: Jasmine Sullivan  Procedure(s) Performed: CATARACT EXTRACTION PHACO AND INTRAOCULAR LENS PLACEMENT ([facility]) (Right Eye)  Patient Location: PACU and Short Stay  Anesthesia Type:MAC  Level of Consciousness: awake, alert  and oriented  Airway & Oxygen Therapy: Patient Spontanous Breathing  Post-op Assessment: Report given to RN and Post -op Vital signs reviewed and stable  Post vital signs: Reviewed and stable  Last Vitals:  Vitals:   03/23/17 0955 03/23/17 1136  BP: 136/63 (!) 170/67  Pulse: 73 65  Resp: 16 16  Temp: (!) 36.1 C 36.6 C  SpO2: 99% 100%    Last Pain:  Vitals:   03/23/17 0955  TempSrc: Tympanic         Complications: No apparent anesthesia complications

## 2017-03-23 NOTE — Discharge Instructions (Signed)
Eye Surgery Discharge Instructions  Expect mild scratchy sensation or mild soreness. DO NOT RUB YOUR EYE!  The day of surgery:  Minimal physical activity, but bed rest is not required  No reading, computer work, or close hand work  No bending, lifting, or straining.  May watch TV  For 24 hours:  No driving, legal decisions, or alcoholic beverages  Safety precautions  Eat anything you prefer: It is better to start with liquids, then soup then solid foods.  _____ Eye patch should be worn until postoperative exam tomorrow.  ____ Solar shield eyeglasses should be worn for comfort in the sunlight/patch while sleeping  Resume all regular medications including aspirin or Coumadin if these were discontinued prior to surgery. You may shower, bathe, shave, or wash your hair. Tylenol may be taken for mild discomfort.  Call your doctor if you experience significant pain, nausea, or vomiting, fever > 101 or other signs of infection. [phone] or [phone] Specific instructions:  Follow-up Information    Galen ManilaPorfilio, William, MD Follow up.   Specialty:  Ophthalmology Why:  November 28 at 9:05am Contact information: [address] [place] [address] [phone]

## 2017-03-23 NOTE — Anesthesia Preprocedure Evaluation (Signed)
Anesthesia Evaluation  Patient identified by MRN, date of birth, ID band Patient awake    Reviewed: Allergy & Precautions, H&P , NPO status , Patient's Chart, lab work & pertinent test results, reviewed documented beta blocker date and time   History of Anesthesia Complications Negative for: history of anesthetic complications  Airway Mallampati: I  TM Distance: >3 FB Neck ROM: full    Dental  (+) Caps, Dental Advidsory Given   Pulmonary shortness of breath and with exertion, asthma , neg sleep apnea, COPD,  COPD inhaler, neg recent URI, former smoker,    breath sounds clear to auscultation       Cardiovascular Exercise Tolerance: Good hypertension, (-) angina(-) CAD, (-) Past MI, (-) Cardiac Stents and (-) CABG (-) dysrhythmias + Valvular Problems/Murmurs      Neuro/Psych negative neurological ROS  negative psych ROS   GI/Hepatic negative GI ROS, Neg liver ROS,   Endo/Other  negative endocrine ROS  Renal/GU negative Renal ROS  negative genitourinary   Musculoskeletal   Abdominal   Peds  Hematology negative hematology ROS (+)   Anesthesia Other Findings Past Medical History: No date: Asthma No date: COPD (chronic obstructive pulmonary disease) (HCC) No date: Dyspnea     Comment:  DOE No date: Heart murmur No date: Hypertension   Reproductive/Obstetrics negative OB ROS                             Anesthesia Physical Anesthesia Plan  ASA: II  Anesthesia Plan: MAC   Post-op Pain Management:    Induction:   PONV Risk Score and Plan:   Airway Management Planned: Nasal Cannula  Additional Equipment:   Intra-op Plan:   Post-operative Plan:   Informed Consent: I have reviewed the patients History and Physical, chart, labs and discussed the procedure including the risks, benefits and alternatives for the proposed anesthesia with the patient or authorized representative who has  indicated his/her understanding and acceptance.   Dental Advisory Given  Plan Discussed with: Anesthesiologist, CRNA and Surgeon  Anesthesia Plan Comments:         Anesthesia Quick Evaluation

## 2017-09-30 ENCOUNTER — Other Ambulatory Visit: Payer: Self-pay | Admitting: Internal Medicine

## 2017-09-30 DIAGNOSIS — Z1231 Encounter for screening mammogram for malignant neoplasm of breast: Secondary | ICD-10-CM

## 2017-11-18 ENCOUNTER — Ambulatory Visit
Admission: RE | Admit: 2017-11-18 | Discharge: 2017-11-18 | Disposition: A | Discharge status: Home / Self Care | Payer: Medicare Other | Source: Ambulatory Visit | Attending: Internal Medicine | Admitting: Internal Medicine

## 2017-11-18 DIAGNOSIS — Z1231 Encounter for screening mammogram for malignant neoplasm of breast: Secondary | ICD-10-CM | POA: Diagnosis not present

## 2018-09-01 ENCOUNTER — Other Ambulatory Visit: Payer: Self-pay | Admitting: Family

## 2018-09-01 DIAGNOSIS — Z1231 Encounter for screening mammogram for malignant neoplasm of breast: Secondary | ICD-10-CM

## 2018-11-23 ENCOUNTER — Ambulatory Visit
Admission: RE | Admit: 2018-11-23 | Discharge: 2018-11-23 | Disposition: A | Discharge status: Home / Self Care | Payer: Medicare Other | Source: Ambulatory Visit | Attending: Family | Admitting: Family

## 2018-11-23 ENCOUNTER — Other Ambulatory Visit: Payer: Self-pay

## 2018-11-23 DIAGNOSIS — Z1231 Encounter for screening mammogram for malignant neoplasm of breast: Secondary | ICD-10-CM | POA: Insufficient documentation

## 2019-02-27 ENCOUNTER — Other Ambulatory Visit: Payer: Self-pay | Admitting: Orthopedic Surgery

## 2019-02-27 DIAGNOSIS — M170 Bilateral primary osteoarthritis of knee: Secondary | ICD-10-CM

## 2019-03-07 ENCOUNTER — Ambulatory Visit
Admission: RE | Admit: 2019-03-07 | Discharge: 2019-03-07 | Disposition: A | Discharge status: Home / Self Care | Payer: Medicare Other | Source: Ambulatory Visit | Attending: Orthopedic Surgery | Admitting: Orthopedic Surgery

## 2019-03-07 ENCOUNTER — Other Ambulatory Visit: Payer: Self-pay

## 2019-03-07 DIAGNOSIS — M170 Bilateral primary osteoarthritis of knee: Secondary | ICD-10-CM | POA: Insufficient documentation

## 2019-03-17 ENCOUNTER — Other Ambulatory Visit: Payer: Self-pay | Admitting: Orthopedic Surgery

## 2019-03-30 ENCOUNTER — Encounter
Admission: RE | Admit: 2019-03-30 | Discharge: 2019-03-30 | Disposition: A | Discharge status: Home / Self Care | Payer: Medicare Other | Source: Ambulatory Visit | Attending: Orthopedic Surgery | Admitting: Orthopedic Surgery

## 2019-03-30 ENCOUNTER — Other Ambulatory Visit: Payer: Self-pay

## 2019-03-30 DIAGNOSIS — I440 Atrioventricular block, first degree: Secondary | ICD-10-CM | POA: Insufficient documentation

## 2019-03-30 DIAGNOSIS — Z01818 Encounter for other preprocedural examination: Secondary | ICD-10-CM | POA: Insufficient documentation

## 2019-03-30 DIAGNOSIS — R9431 Abnormal electrocardiogram [ECG] [EKG]: Secondary | ICD-10-CM | POA: Insufficient documentation

## 2019-03-30 HISTORY — DX: Unspecified osteoarthritis, unspecified site: M19.90

## 2019-03-30 HISTORY — DX: Fatty (change of) liver, not elsewhere classified: K76.0

## 2019-03-30 LAB — CBC WITH DIFFERENTIAL/PLATELET
Abs Immature Granulocytes: 0.03 10*3/uL (ref 0.00–0.07)
Basophils Absolute: 0.1 10*3/uL (ref 0.0–0.1)
Basophils Relative: 1 %
Eosinophils Absolute: 0.1 10*3/uL (ref 0.0–0.5)
Eosinophils Relative: 1 %
HCT: 45.4 % (ref 36.0–46.0)
Hemoglobin: 15 g/dL (ref 12.0–15.0)
Immature Granulocytes: 0 %
Lymphocytes Relative: 24 %
Lymphs Abs: 2.4 10*3/uL (ref 0.7–4.0)
MCH: 30.1 pg (ref 26.0–34.0)
MCHC: 33 g/dL (ref 30.0–36.0)
MCV: 91.2 fL (ref 80.0–100.0)
Monocytes Absolute: 0.8 10*3/uL (ref 0.1–1.0)
Monocytes Relative: 8 %
Neutro Abs: 6.6 10*3/uL (ref 1.7–7.7)
Neutrophils Relative %: 66 %
Platelets: 248 10*3/uL (ref 150–400)
RBC: 4.98 MIL/uL (ref 3.87–5.11)
RDW: 12.9 % (ref 11.5–15.5)
WBC: 10 10*3/uL (ref 4.0–10.5)
nRBC: 0 % (ref 0.0–0.2)

## 2019-03-30 LAB — PROTIME-INR
INR: 1.1 (ref 0.8–1.2)
Prothrombin Time: 14.1 seconds (ref 11.4–15.2)

## 2019-03-30 LAB — URINALYSIS, ROUTINE W REFLEX MICROSCOPIC
Bilirubin Urine: NEGATIVE
Glucose, UA: 50 mg/dL — AB
Hgb urine dipstick: NEGATIVE
Ketones, ur: NEGATIVE mg/dL
Leukocytes,Ua: NEGATIVE
Nitrite: NEGATIVE
Protein, ur: NEGATIVE mg/dL
Specific Gravity, Urine: 1.009 (ref 1.005–1.030)
pH: 6 (ref 5.0–8.0)

## 2019-03-30 LAB — BASIC METABOLIC PANEL
Anion gap: 8 (ref 5–15)
BUN: 15 mg/dL (ref 8–23)
CO2: 27 mmol/L (ref 22–32)
Calcium: 9.1 mg/dL (ref 8.9–10.3)
Chloride: 104 mmol/L (ref 98–111)
Creatinine, Ser: 0.67 mg/dL (ref 0.44–1.00)
GFR calc Af Amer: >60 mL/min (ref >60)
GFR calc non Af Amer: >60 mL/min (ref >60)
Glucose, Bld: 118 mg/dL — ABNORMAL HIGH (ref 70–99)
Potassium: 3.7 mmol/L (ref 3.5–5.1)
Sodium: 139 mmol/L (ref 135–145)

## 2019-03-30 LAB — TYPE AND SCREEN
ABO/RH(D): O POS
Antibody Screen: NEGATIVE

## 2019-03-30 LAB — SURGICAL PCR SCREEN
MRSA, PCR: NEGATIVE
Staphylococcus aureus: POSITIVE — AB

## 2019-03-30 LAB — APTT: aPTT: 28 seconds (ref 24–36)

## 2019-03-30 NOTE — Patient Instructions (Addendum)
Your procedure is scheduled on: Tues 12/15 Report to Day Surgery. To find out your arrival time please call [phone] between 1PM - 3PM on Mon 12/14.  Remember: Instructions that are not followed completely may result in serious medical risk,  up to and including death, or upon the discretion of your surgeon and anesthesiologist your  surgery may need to be rescheduled.     _X__ 1. Do not eat food after midnight the night before your procedure.                 No gum chewing or hard candies. You may drink clear liquids up to 2 hours                 before you are scheduled to arrive for your surgery- DO not drink clear                 liquids within 2 hours of the start of your surgery.                 Clear Liquids include:  water, apple juice without pulp, clear carbohydrate                 drink such as Clearfast of Gatorade, Black Coffee or Tea (Do not add                 anything to coffee or tea).  __X__2.  On the morning of surgery brush your teeth with toothpaste and water, you                may rinse your mouth with mouthwash if you wish.  Do not swallow any toothpaste of mouthwash.     _X__ 3.  No Alcohol for 24 hours before or after surgery.   ___ 4.  Do Not Smoke or use e-cigarettes For 24 Hours Prior to Your Surgery.                 Do not use any chewable tobacco products for at least 6 hours prior to                 surgery.  ____  5.  Bring all medications with you on the day of surgery if instructed.   __x__  6.  Notify your doctor if there is any change in your medical condition      (cold, fever, infections).     Do not wear jewelry, make-up, hairpins, clips or nail polish. Do not wear lotions, powders, or perfumes. You may wear deodorant. Do not shave 48 hours prior to surgery. Men may shave face and neck. Do not bring valuables to the hospital.    [facility] is not responsible for any belongings or valuables.  Contacts, dentures  or bridgework may not be worn into surgery. Leave your suitcase in the car. After surgery it may be brought to your room. For patients admitted to the hospital, discharge time is determined by your treatment team.   Patients discharged the day of surgery will not be allowed to drive home.   Please read over the following fact sheets that you were given:    __x__ Take these medicines the morning of surgery with A SIP OF WATER:    1. montelukast (SINGULAIR) 10 MG tablet  2.   3.   4.  5.  6.  ____ Fleet Enema (as directed)   ____ Use CHG Soap as directed  Not using due  to allergy  __x__ Use inhalers on the day of surgery albuterol (PROVENTIL HFA;VENTOLIN HFA) 108 (90 Base) MCG/ACT inhaler and bring it with you  ____ Stop metformin 2 days prior to surgery    ____ Take 1/2 of usual insulin dose the night before surgery. No insulin the morning          of surgery.   __x__ Stop  aspirin EC 81 MG tablet on 12/8  __x__ Stop Anti-inflammatories  ibuprofen (ADVIL,MOTRIN) 200 MG tablet, naproxen sodium (ALEVE) 220 MG tablet on 12/8  May take tylenol   __x__ Stop supplements until after surgery. Omega-3 Fatty Acids (FISH OIL) 1000 MG CAPS On 12/8  ____ Bring C-Pap to the hospital.

## 2019-03-31 LAB — URINE CULTURE: Culture: NO GROWTH

## 2019-04-03 NOTE — Pre-Procedure Instructions (Signed)
CONTINUE ANCEF PER TIFFANY AT DR [facility] OFFICE

## 2019-04-07 ENCOUNTER — Other Ambulatory Visit: Admission: RE | Admit: 2019-04-07 | Payer: Medicare Other | Source: Ambulatory Visit

## 2019-04-11 ENCOUNTER — Inpatient Hospital Stay: Admission: RE | Admit: 2019-04-11 | Payer: Medicare Other | Source: Home / Self Care | Admitting: Orthopedic Surgery

## 2019-04-11 ENCOUNTER — Encounter: Admission: RE | Payer: Self-pay | Source: Home / Self Care

## 2019-04-11 SURGERY — ARTHROPLASTY, KNEE, TOTAL
Anesthesia: Choice | Site: Knee | Laterality: Left

## 2019-05-01 ENCOUNTER — Other Ambulatory Visit: Payer: Self-pay | Admitting: Orthopedic Surgery

## 2019-05-05 ENCOUNTER — Other Ambulatory Visit: Payer: Self-pay

## 2019-05-05 ENCOUNTER — Other Ambulatory Visit
Admission: RE | Admit: 2019-05-05 | Discharge: 2019-05-05 | Disposition: A | Discharge status: Home / Self Care | Payer: Medicare PPO | Source: Ambulatory Visit | Attending: Orthopedic Surgery | Admitting: Orthopedic Surgery

## 2019-05-05 DIAGNOSIS — Z20822 Contact with and (suspected) exposure to covid-19: Secondary | ICD-10-CM | POA: Insufficient documentation

## 2019-05-05 DIAGNOSIS — Z01812 Encounter for preprocedural laboratory examination: Secondary | ICD-10-CM | POA: Insufficient documentation

## 2019-05-05 LAB — SARS CORONAVIRUS 2 (TAT 6-24 HRS): SARS Coronavirus 2: NEGATIVE

## 2019-05-08 MED ORDER — TRANEXAMIC ACID-NACL 1000-0.7 MG/100ML-% IV SOLN: 1000.0000 mg | INTRAVENOUS | Status: DC

## 2019-05-08 MED ORDER — CEFAZOLIN SODIUM-DEXTROSE 2-4 GM/100ML-% IV SOLN
2.0000 g | INTRAVENOUS | Status: AC
  Administered 2019-05-09: 08:00:00 2 g via INTRAVENOUS

## 2019-05-09 ENCOUNTER — Ambulatory Visit: Payer: Medicare PPO | Admitting: Anesthesiology

## 2019-05-09 ENCOUNTER — Encounter
Admission: RE | Disposition: A | Discharge status: Home / Self Care | Payer: Self-pay | Source: Home / Self Care | Attending: Orthopedic Surgery

## 2019-05-09 ENCOUNTER — Ambulatory Visit
Admission: RE | Admit: 2019-05-09 | Discharge: 2019-05-09 | Disposition: A | Discharge status: Home / Self Care | Payer: Medicare PPO | Attending: Orthopedic Surgery | Admitting: Orthopedic Surgery

## 2019-05-09 ENCOUNTER — Ambulatory Visit: Payer: Medicare PPO

## 2019-05-09 ENCOUNTER — Other Ambulatory Visit: Payer: Self-pay

## 2019-05-09 ENCOUNTER — Encounter: Payer: Self-pay | Admitting: Orthopedic Surgery

## 2019-05-09 DIAGNOSIS — Z87891 Personal history of nicotine dependence: Secondary | ICD-10-CM | POA: Insufficient documentation

## 2019-05-09 DIAGNOSIS — Z8249 Family history of ischemic heart disease and other diseases of the circulatory system: Secondary | ICD-10-CM | POA: Diagnosis not present

## 2019-05-09 DIAGNOSIS — Z881 Allergy status to other antibiotic agents status: Secondary | ICD-10-CM | POA: Insufficient documentation

## 2019-05-09 DIAGNOSIS — M170 Bilateral primary osteoarthritis of knee: Secondary | ICD-10-CM | POA: Insufficient documentation

## 2019-05-09 DIAGNOSIS — Z7982 Long term (current) use of aspirin: Secondary | ICD-10-CM | POA: Insufficient documentation

## 2019-05-09 DIAGNOSIS — E785 Hyperlipidemia, unspecified: Secondary | ICD-10-CM | POA: Diagnosis not present

## 2019-05-09 DIAGNOSIS — Z96652 Presence of left artificial knee joint: Secondary | ICD-10-CM

## 2019-05-09 DIAGNOSIS — I10 Essential (primary) hypertension: Secondary | ICD-10-CM | POA: Diagnosis not present

## 2019-05-09 DIAGNOSIS — Z79899 Other long term (current) drug therapy: Secondary | ICD-10-CM | POA: Diagnosis not present

## 2019-05-09 DIAGNOSIS — Z888 Allergy status to other drugs, medicaments and biological substances status: Secondary | ICD-10-CM | POA: Diagnosis not present

## 2019-05-09 DIAGNOSIS — M797 Fibromyalgia: Secondary | ICD-10-CM | POA: Diagnosis not present

## 2019-05-09 HISTORY — PX: TOTAL KNEE ARTHROPLASTY: SHX125

## 2019-05-09 LAB — TYPE AND SCREEN
ABO/RH(D): O POS
Antibody Screen: NEGATIVE

## 2019-05-09 LAB — CBC
HCT: 41.8 % (ref 36.0–46.0)
Hemoglobin: 13.4 g/dL (ref 12.0–15.0)
MCH: 29.7 pg (ref 26.0–34.0)
MCHC: 32.1 g/dL (ref 30.0–36.0)
MCV: 92.7 fL (ref 80.0–100.0)
Platelets: 222 10*3/uL (ref 150–400)
RBC: 4.51 MIL/uL (ref 3.87–5.11)
RDW: 13.2 % (ref 11.5–15.5)
WBC: 7.8 10*3/uL (ref 4.0–10.5)
nRBC: 0 % (ref 0.0–0.2)

## 2019-05-09 LAB — SURGICAL PCR SCREEN
MRSA, PCR: NEGATIVE
Staphylococcus aureus: POSITIVE — AB

## 2019-05-09 LAB — POCT I-STAT, CHEM 8
BUN: 10 mg/dL (ref 8–23)
Calcium, Ion: 1.19 mmol/L (ref 1.15–1.40)
Chloride: 105 mmol/L (ref 98–111)
Creatinine, Ser: 0.6 mg/dL (ref 0.44–1.00)
Glucose, Bld: 247 mg/dL — ABNORMAL HIGH (ref 70–99)
HCT: 44 % (ref 36.0–46.0)
Hemoglobin: 15 g/dL (ref 12.0–15.0)
Potassium: 3.5 mmol/L (ref 3.5–5.1)
Sodium: 140 mmol/L (ref 135–145)
TCO2: 24 mmol/L (ref 22–32)

## 2019-05-09 LAB — CREATININE, SERUM
Creatinine, Ser: 0.7 mg/dL (ref 0.44–1.00)
GFR calc Af Amer: >60 mL/min (ref >60)
GFR calc non Af Amer: >60 mL/min (ref >60)

## 2019-05-09 SURGERY — ARTHROPLASTY, KNEE, TOTAL
Anesthesia: Spinal | Site: Knee | Laterality: Left

## 2019-05-09 MED ORDER — CEFAZOLIN SODIUM-DEXTROSE 2-4 GM/100ML-% IV SOLN
INTRAVENOUS | Status: AC
  Administered 2019-05-09: 2 g via INTRAVENOUS
  Filled 2019-05-09: qty 100

## 2019-05-09 MED ORDER — ZOLPIDEM TARTRATE 5 MG PO TABS: 5.0000 mg | ORAL_TABLET | Freq: Every evening | ORAL | Status: DC | PRN

## 2019-05-09 MED ORDER — DOCUSATE SODIUM 100 MG PO CAPS: 100.0000 mg | ORAL_CAPSULE | Freq: Two times a day (BID) | ORAL | Status: DC

## 2019-05-09 MED ORDER — FUROSEMIDE 20 MG PO TABS
20.0000 mg | ORAL_TABLET | Freq: Every day | ORAL | Status: DC
  Administered 2019-05-09: 20 mg via ORAL
  Filled 2019-05-09: qty 1

## 2019-05-09 MED ORDER — MONTELUKAST SODIUM 10 MG PO TABS: 10.0000 mg | ORAL_TABLET | Freq: Every day | ORAL | Status: DC

## 2019-05-09 MED ORDER — CEFAZOLIN SODIUM-DEXTROSE 2-4 GM/100ML-% IV SOLN: 2.0000 g | Freq: Four times a day (QID) | INTRAVENOUS | Status: DC

## 2019-05-09 MED ORDER — FENTANYL CITRATE (PF) 100 MCG/2ML IJ SOLN
INTRAMUSCULAR | Status: AC
  Filled 2019-05-09: qty 2

## 2019-05-09 MED ORDER — PROPYLENE GLYCOL 0.6 % OP SOLN: 1.0000 [drp] | Freq: Three times a day (TID) | OPHTHALMIC | Status: DC | PRN

## 2019-05-09 MED ORDER — ONDANSETRON HCL 4 MG/2ML IJ SOLN: 4.0000 mg | Freq: Four times a day (QID) | INTRAMUSCULAR | Status: DC | PRN

## 2019-05-09 MED ORDER — MIDAZOLAM HCL 5 MG/5ML IJ SOLN
INTRAMUSCULAR | Status: DC | PRN
  Administered 2019-05-09: 2 mg via INTRAVENOUS

## 2019-05-09 MED ORDER — PROPOFOL 500 MG/50ML IV EMUL
INTRAVENOUS | Status: DC | PRN
  Administered 2019-05-09: 100 ug/kg/min via INTRAVENOUS

## 2019-05-09 MED ORDER — ONDANSETRON HCL 4 MG PO TABS: 4.0000 mg | ORAL_TABLET | Freq: Four times a day (QID) | ORAL | Status: DC | PRN

## 2019-05-09 MED ORDER — NEOMYCIN-POLYMYXIN B GU 40-200000 IR SOLN
Status: DC | PRN
  Administered 2019-05-09: 16 mL

## 2019-05-09 MED ORDER — NEOMYCIN-POLYMYXIN B GU 40-200000 IR SOLN
Status: AC
  Filled 2019-05-09: qty 20

## 2019-05-09 MED ORDER — DEXAMETHASONE SODIUM PHOSPHATE 10 MG/ML IJ SOLN
INTRAMUSCULAR | Status: DC | PRN
  Administered 2019-05-09: 5 mg via INTRAVENOUS

## 2019-05-09 MED ORDER — MIDAZOLAM HCL 2 MG/2ML IJ SOLN
INTRAMUSCULAR | Status: AC
  Filled 2019-05-09: qty 2

## 2019-05-09 MED ORDER — HYDROCODONE-ACETAMINOPHEN 7.5-325 MG PO TABS
1.0000 | ORAL_TABLET | ORAL | Status: DC | PRN
  Filled 2019-05-09: qty 2

## 2019-05-09 MED ORDER — BUPIVACAINE HCL (PF) 0.25 % IJ SOLN
INTRAMUSCULAR | Status: AC
  Filled 2019-05-09: qty 30

## 2019-05-09 MED ORDER — BUPIVACAINE IN DEXTROSE 0.75-8.25 % IT SOLN
INTRATHECAL | Status: DC | PRN
  Administered 2019-05-09: 1.3 mL via INTRATHECAL

## 2019-05-09 MED ORDER — ONDANSETRON HCL 4 MG/2ML IJ SOLN: 4.0000 mg | Freq: Once | INTRAMUSCULAR | Status: DC | PRN

## 2019-05-09 MED ORDER — LACTATED RINGERS IV SOLN: INTRAVENOUS | Status: DC | PRN

## 2019-05-09 MED ORDER — MORPHINE SULFATE (PF) 4 MG/ML IV SOLN: 0.5000 mg | INTRAVENOUS | Status: DC | PRN

## 2019-05-09 MED ORDER — ASPIRIN 81 MG PO TBEC: 81.0000 mg | DELAYED_RELEASE_TABLET | Freq: Every day | ORAL | Status: DC

## 2019-05-09 MED ORDER — METHOCARBAMOL 1000 MG/10ML IJ SOLN: 500.0000 mg | Freq: Four times a day (QID) | INTRAVENOUS | Status: DC | PRN

## 2019-05-09 MED ORDER — RALOXIFENE HCL 60 MG PO TABS: 60.0000 mg | ORAL_TABLET | Freq: Every day | ORAL | Status: DC

## 2019-05-09 MED ORDER — ENOXAPARIN SODIUM 30 MG/0.3ML ~~LOC~~ SOLN: 30.0000 mg | Freq: Two times a day (BID) | SUBCUTANEOUS | Status: DC

## 2019-05-09 MED ORDER — SIMVASTATIN 10 MG PO TABS: 10.0000 mg | ORAL_TABLET | Freq: Every day | ORAL | Status: DC

## 2019-05-09 MED ORDER — LACTATED RINGERS IV SOLN: INTRAVENOUS | Status: DC

## 2019-05-09 MED ORDER — BUPIVACAINE-EPINEPHRINE (PF) 0.25% -1:200000 IJ SOLN
INTRAMUSCULAR | Status: DC | PRN
  Administered 2019-05-09: 30 mL

## 2019-05-09 MED ORDER — ALUM & MAG HYDROXIDE-SIMETH 200-200-20 MG/5ML PO SUSP: 30.0000 mL | ORAL | Status: DC | PRN

## 2019-05-09 MED ORDER — METHOCARBAMOL 500 MG PO TABS: 500.0000 mg | ORAL_TABLET | Freq: Four times a day (QID) | ORAL | Status: DC | PRN

## 2019-05-09 MED ORDER — FENTANYL CITRATE (PF) 100 MCG/2ML IJ SOLN
INTRAMUSCULAR | Status: AC
  Administered 2019-05-09: 25 ug via INTRAVENOUS
  Filled 2019-05-09: qty 2

## 2019-05-09 MED ORDER — METOCLOPRAMIDE HCL 5 MG/ML IJ SOLN: 5.0000 mg | Freq: Three times a day (TID) | INTRAMUSCULAR | Status: DC | PRN

## 2019-05-09 MED ORDER — CEFAZOLIN SODIUM-DEXTROSE 2-4 GM/100ML-% IV SOLN
INTRAVENOUS | Status: AC
  Filled 2019-05-09: qty 100

## 2019-05-09 MED ORDER — EPINEPHRINE PF 1 MG/ML IJ SOLN
INTRAMUSCULAR | Status: AC
  Filled 2019-05-09: qty 1

## 2019-05-09 MED ORDER — KETOROLAC TROMETHAMINE 30 MG/ML IJ SOLN
INTRAMUSCULAR | Status: DC | PRN
  Administered 2019-05-09: 30 mg via INTRAMUSCULAR

## 2019-05-09 MED ORDER — MORPHINE SULFATE 10 MG/ML IJ SOLN
INTRAMUSCULAR | Status: DC | PRN
  Administered 2019-05-09: 10 mg via INTRAMUSCULAR

## 2019-05-09 MED ORDER — FENTANYL CITRATE (PF) 100 MCG/2ML IJ SOLN
25.0000 ug | INTRAMUSCULAR | Status: DC | PRN
  Administered 2019-05-09 (×3): 25 ug via INTRAVENOUS

## 2019-05-09 MED ORDER — ALBUTEROL SULFATE HFA 108 (90 BASE) MCG/ACT IN AERS: 1.0000 | INHALATION_SPRAY | Freq: Four times a day (QID) | RESPIRATORY_TRACT | Status: DC | PRN

## 2019-05-09 MED ORDER — SODIUM CHLORIDE FLUSH 0.9 % IV SOLN
INTRAVENOUS | Status: AC
  Filled 2019-05-09: qty 40

## 2019-05-09 MED ORDER — SODIUM CHLORIDE 0.9 % IV SOLN
INTRAVENOUS | Status: DC | PRN
  Administered 2019-05-09: 08:00:00 60 mL

## 2019-05-09 MED ORDER — TRANEXAMIC ACID-NACL 1000-0.7 MG/100ML-% IV SOLN
INTRAVENOUS | Status: DC | PRN
  Administered 2019-05-09: 1000 mg via INTRAVENOUS

## 2019-05-09 MED ORDER — FENTANYL CITRATE (PF) 100 MCG/2ML IJ SOLN
INTRAMUSCULAR | Status: DC | PRN
  Administered 2019-05-09: 25 ug via INTRAVENOUS

## 2019-05-09 MED ORDER — TRANEXAMIC ACID-NACL 1000-0.7 MG/100ML-% IV SOLN
INTRAVENOUS | Status: AC
  Filled 2019-05-09: qty 100

## 2019-05-09 MED ORDER — PROPOFOL 500 MG/50ML IV EMUL
INTRAVENOUS | Status: AC
  Filled 2019-05-09: qty 50

## 2019-05-09 MED ORDER — CALCIUM CARB-CHOLECALCIFEROL 600-800 MG-UNIT PO TABS: 1.0000 | ORAL_TABLET | Freq: Every day | ORAL | Status: DC

## 2019-05-09 MED ORDER — SODIUM CHLORIDE (PF) 0.9 % IJ SOLN
INTRAMUSCULAR | Status: DC | PRN
  Administered 2019-05-09: 28 mL

## 2019-05-09 MED ORDER — LACTATED RINGERS IV BOLUS
250.0000 mL | Freq: Once | INTRAVENOUS | Status: AC
  Administered 2019-05-09: 250 mL via INTRAVENOUS

## 2019-05-09 MED ORDER — IRBESARTAN 75 MG PO TABS: 75.0000 mg | ORAL_TABLET | Freq: Every day | ORAL | Status: DC

## 2019-05-09 MED ORDER — FAMOTIDINE 20 MG PO TABS: 20.0000 mg | ORAL_TABLET | Freq: Once | ORAL | Status: AC

## 2019-05-09 MED ORDER — BUPIVACAINE LIPOSOME 1.3 % IJ SUSP
INTRAMUSCULAR | Status: AC
  Filled 2019-05-09: qty 20

## 2019-05-09 MED ORDER — SODIUM CHLORIDE 0.9 % IV SOLN: INTRAVENOUS | Status: DC

## 2019-05-09 MED ORDER — ENOXAPARIN SODIUM 40 MG/0.4ML ~~LOC~~ SOLN: 40.0000 mg | SUBCUTANEOUS | 0 refills | Status: DC

## 2019-05-09 MED ORDER — ACETAMINOPHEN 325 MG PO TABS: 325.0000 mg | ORAL_TABLET | Freq: Four times a day (QID) | ORAL | Status: DC | PRN

## 2019-05-09 MED ORDER — HYDROCODONE-ACETAMINOPHEN 5-325 MG PO TABS: 1.0000 | ORAL_TABLET | ORAL | Status: DC | PRN

## 2019-05-09 MED ORDER — PHENOL 1.4 % MT LIQD
1.0000 | OROMUCOSAL | Status: DC | PRN
  Filled 2019-05-09: qty 177

## 2019-05-09 MED ORDER — ONDANSETRON HCL 4 MG/2ML IJ SOLN
INTRAMUSCULAR | Status: DC | PRN
  Administered 2019-05-09: 4 mg via INTRAVENOUS

## 2019-05-09 MED ORDER — PROPOFOL 10 MG/ML IV BOLUS
INTRAVENOUS | Status: DC | PRN
  Administered 2019-05-09: 30 ug via INTRAVENOUS

## 2019-05-09 MED ORDER — METOCLOPRAMIDE HCL 10 MG PO TABS: 5.0000 mg | ORAL_TABLET | Freq: Three times a day (TID) | ORAL | Status: DC | PRN

## 2019-05-09 MED ORDER — TRAMADOL HCL 50 MG PO TABS
ORAL_TABLET | ORAL | Status: AC
  Administered 2019-05-09: 50 mg via ORAL
  Filled 2019-05-09: qty 1

## 2019-05-09 MED ORDER — TRAMADOL HCL 50 MG PO TABS: 50.0000 mg | ORAL_TABLET | Freq: Four times a day (QID) | ORAL | Status: DC

## 2019-05-09 MED ORDER — FAMOTIDINE 20 MG PO TABS
ORAL_TABLET | ORAL | Status: AC
  Administered 2019-05-09: 20 mg via ORAL
  Filled 2019-05-09: qty 1

## 2019-05-09 MED ORDER — OXYCODONE HCL 5 MG PO TABS: 5.0000 mg | ORAL_TABLET | ORAL | 0 refills | Status: AC | PRN

## 2019-05-09 MED ORDER — MENTHOL 3 MG MT LOZG
1.0000 | LOZENGE | OROMUCOSAL | Status: DC | PRN
  Filled 2019-05-09: qty 9

## 2019-05-09 SURGICAL SUPPLY — 70 items
BLADE SAGITTAL 25.0X1.19X90 (BLADE) ×2 IMPLANT
BLOCK CUTTING FEMUR 4+ LT MED (MISCELLANEOUS) ×2 IMPLANT
BLOCK CUTTING TIBIAL 4 LT CT (MISCELLANEOUS) ×2 IMPLANT
BNDG ELASTIC 6X5.8 VLCR STR LF (GAUZE/BANDAGES/DRESSINGS) ×2 IMPLANT
CANISTER SUCT 1200ML W/VALVE (MISCELLANEOUS) ×2 IMPLANT
CANISTER SUCT 3000ML PPV (MISCELLANEOUS) ×4 IMPLANT
CANISTER WOUND CARE 500ML ATS (WOUND CARE) IMPLANT
CEMENT HV SMART SET (Cement) ×2 IMPLANT
CHLORAPREP W/TINT 26 (MISCELLANEOUS) ×4 IMPLANT
COOLER POLAR GLACIER W/PUMP (MISCELLANEOUS) ×2 IMPLANT
COVER WAND RF STERILE (DRAPES) ×2 IMPLANT
CUFF TOURN SGL QUICK 24 (TOURNIQUET CUFF)
CUFF TOURN SGL QUICK 30 (TOURNIQUET CUFF) ×1
CUFF TRNQT CYL 24X4X16.5-23 (TOURNIQUET CUFF) IMPLANT
CUFF TRNQT CYL 30X4X21-28X (TOURNIQUET CUFF) ×1 IMPLANT
DRAPE 3/4 80X56 (DRAPES) ×4 IMPLANT
ELECT CAUTERY BLADE 6.4 (BLADE) ×2 IMPLANT
ELECT REM PT RETURN 9FT ADLT (ELECTROSURGICAL) ×2
ELECTRODE REM PT RTRN 9FT ADLT (ELECTROSURGICAL) ×1 IMPLANT
FEMORAL COMP SZ4P LT SPHERE (Femur) ×2 IMPLANT
FEMUR BONE MODEL 4.9010 MEDACT (MISCELLANEOUS) ×2 IMPLANT
GAUZE SPONGE 4X4 12PLY STRL (GAUZE/BANDAGES/DRESSINGS) ×2 IMPLANT
GAUZE XEROFORM 1X8 LF (GAUZE/BANDAGES/DRESSINGS) ×2 IMPLANT
GLOVE BIOGEL PI IND STRL 9 (GLOVE) ×1 IMPLANT
GLOVE BIOGEL PI INDICATOR 9 (GLOVE) ×1
GLOVE INDICATOR 8.0 STRL GRN (GLOVE) ×2 IMPLANT
GLOVE SURG ORTHO 8.0 STRL STRW (GLOVE) ×2 IMPLANT
GLOVE SURG SYN 9.0  PF PI (GLOVE) ×1
GLOVE SURG SYN 9.0 PF PI (GLOVE) ×1 IMPLANT
GOWN SRG 2XL LVL 4 RGLN SLV (GOWNS) ×1 IMPLANT
GOWN STRL NON-REIN 2XL LVL4 (GOWNS) ×1
GOWN STRL REUS W/ TWL LRG LVL3 (GOWN DISPOSABLE) ×1 IMPLANT
GOWN STRL REUS W/ TWL XL LVL3 (GOWN DISPOSABLE) ×1 IMPLANT
GOWN STRL REUS W/TWL LRG LVL3 (GOWN DISPOSABLE) ×1
GOWN STRL REUS W/TWL XL LVL3 (GOWN DISPOSABLE) ×1
HOLDER FOLEY CATH W/STRAP (MISCELLANEOUS) IMPLANT
HOOD PEEL AWAY FLYTE STAYCOOL (MISCELLANEOUS) ×4 IMPLANT
KIT PREVENA INCISION MGT20CM45 (CANNISTER) ×2 IMPLANT
KIT TURNOVER KIT A (KITS) ×2 IMPLANT
NDL SAFETY ECLIPSE 18X1.5 (NEEDLE) ×1 IMPLANT
NEEDLE HYPO 18GX1.5 SHARP (NEEDLE) ×1
NEEDLE SPNL 18GX3.5 QUINCKE PK (NEEDLE) ×2 IMPLANT
NEEDLE SPNL 20GX3.5 QUINCKE YW (NEEDLE) ×2 IMPLANT
NS IRRIG 1000ML POUR BTL (IV SOLUTION) ×2 IMPLANT
PACK TOTAL KNEE (MISCELLANEOUS) ×2 IMPLANT
PAD WRAPON POLAR KNEE (MISCELLANEOUS) ×1 IMPLANT
PATELLA RESURFACING MEDACTA 02 (Bone Implant) ×2 IMPLANT
PENCIL SMOKE EVACUATOR COATED (MISCELLANEOUS) ×2 IMPLANT
PULSAVAC PLUS IRRIG FAN TIP (DISPOSABLE) ×2
SCALPEL PROTECTED #10 DISP (BLADE) ×4 IMPLANT
SOL .9 NS 3000ML IRR  AL (IV SOLUTION) ×1
SOL .9 NS 3000ML IRR UROMATIC (IV SOLUTION) ×1 IMPLANT
STAPLER SKIN PROX 35W (STAPLE) ×2 IMPLANT
STEM EXTENSION 11MMX30MM (Stem) ×2 IMPLANT
SUCTION FRAZIER HANDLE 10FR (MISCELLANEOUS) ×1
SUCTION TUBE FRAZIER 10FR DISP (MISCELLANEOUS) ×1 IMPLANT
SUT DVC 2 QUILL PDO  T11 36X36 (SUTURE) ×1
SUT DVC 2 QUILL PDO T11 36X36 (SUTURE) ×1 IMPLANT
SUT ETHIBOND 2 V 37 (SUTURE) IMPLANT
SUT V-LOC 90 ABS DVC 3-0 CL (SUTURE) ×2 IMPLANT
SYR 20ML LL LF (SYRINGE) ×2 IMPLANT
SYR 50ML LL SCALE MARK (SYRINGE) ×4 IMPLANT
TIBIAL BONE MODEL LEFT (MISCELLANEOUS) ×2 IMPLANT
TIBIAL INSERT SZ4 LT 02120410F (Insert) ×2 IMPLANT
TIBIAL TRAY FIXED MEDACTA 0207 (Bone Implant) ×2 IMPLANT
TIP FAN IRRIG PULSAVAC PLUS (DISPOSABLE) ×1 IMPLANT
TOWEL OR 17X26 4PK STRL BLUE (TOWEL DISPOSABLE) ×2 IMPLANT
TOWER CARTRIDGE SMART MIX (DISPOSABLE) ×2 IMPLANT
TRAY FOLEY MTR SLVR 16FR STAT (SET/KITS/TRAYS/PACK) IMPLANT
WRAPON POLAR PAD KNEE (MISCELLANEOUS) ×2

## 2019-05-09 NOTE — Transfer of Care (Signed)
Immediate Anesthesia Transfer of Care Note  Patient: Martie Muhlbauer  Procedure(s) Performed: LEFT TOTAL KNEE ARTHROPLASTY (Left Knee)  Patient Location: PACU  Anesthesia Type:Spinal  Level of Consciousness: awake and sedated  Airway & Oxygen Therapy: Patient Spontanous Breathing and Patient connected to face mask oxygen  Post-op Assessment: Report given to RN and Post -op Vital signs reviewed and stable  Post vital signs: Reviewed and stable  Last Vitals:  Vitals Value Taken Time  BP    Temp    Pulse    Resp    SpO2      Last Pain: There were no vitals filed for this visit.       Complications: No apparent anesthesia complications

## 2019-05-09 NOTE — Evaluation (Signed)
Physical Therapy Evaluation Patient Details Name: Jasmine Sullivan MRN: [id number] DOB: March 28, 1946 Today's Date: 05/09/2019   History of Present Illness  Jasmine Sullivan is a 59yoF who presents to same day surgery s/p left TKA.  Clinical Impression  Pt admitted with above diagnosis. Pt currently with functional limitations due to the deficits listed below (see "PT Problem List"). Upon entry, pt in bed, awake and agreeable to participate. The pt is alert and oriented x4, pleasant, conversational, and generally a good historian. BP stable from supine to sitting, pt without any syncopal prodrome. Pt requires supervision for bed mobility, transfers, AMB, and min guard assist for stairs. Full precautions, polar care instructions, HEP, and activity limitations discussed with pt, handouts provided. Functional mobility assessment demonstrates increased effort/time requirements, fair tolerance, but need for franks physical assistance, only safety, whereas the patient performed these at a higher level of independence PTA. Patient is ready for DC from PT standpoint, all education completed, and time is given to address all questions/concerns. Pt can resume PT services in venue as directed by surgeon. PT signing off. PT recommends ambulation ad lib or with nursing staff as needed to prevent deconditioning.     Follow Up Recommendations Home health PT;Follow surgeon's recommendation for DC plan and follow-up therapies;Supervision for mobility/OOB    Equipment Recommendations  Rolling walker with 5" wheels;3in1 (PT);Other (comment)(both issued in session by Pryor Curia)    Recommendations for Other Services       Precautions / Restrictions Precautions Precautions: Knee Precaution Booklet Issued: Yes (comment) Required Braces or Orthoses: Knee Immobilizer - Left Knee Immobilizer - Left: Discontinue once straight leg raise with < 10 degree lag Restrictions Weight Bearing Restrictions: Yes LLE Weight Bearing:  Weight bearing as tolerated      Mobility  Bed Mobility Overal bed mobility: Needs Assistance Bed Mobility: Supine to Sit     Supine to sit: Supervision     General bed mobility comments: lines and leads  Transfers Overall transfer level: Needs assistance Equipment used: Rolling walker (2 wheeled) Transfers: Sit to/from Stand Sit to Stand: Supervision         General transfer comment: extensive cues for safe use; pt moving well in general  Ambulation/Gait Ambulation/Gait assistance: Min guard Gait Distance (Feet): 120 Feet Assistive device: Rolling walker (2 wheeled) Gait Pattern/deviations: Antalgic;Step-through pattern Gait velocity: 0.68ms   General Gait Details: able to achieve step through gait without cuing; approaching continuous RW push  Stairs Stairs: Yes Stairs assistance: Min guard Stair Management: Backwards;With walker Number of Stairs: 4    Wheelchair Mobility    Modified Rankin (Stroke Patients Only)       Balance                                             Pertinent Vitals/Pain Pain Assessment: 0-10 Pain Score: 2  Pain Location: Left medial distal knee (calf area) Pain Descriptors / Indicators: Aching Pain Intervention(s): Limited activity within patient's tolerance;Monitored during session;Premedicated before session;Repositioned    Home Living Family/patient expects to be discharged to:: Private residence Living Arrangements: Spouse/significant other(will DC to home of SO) Available Help at Discharge: Family Type of Home: House Home Access: Stairs to enter Entrance Stairs-Rails: None Entrance Stairs-Number of Steps: 1 Home Layout: One level Home Equipment: None      Prior Function Level of Independence: Independent  Hand Dominance        Extremity/Trunk Assessment   Upper Extremity Assessment Upper Extremity Assessment: Overall WFL for tasks assessed    Lower Extremity  Assessment Lower Extremity Assessment: Overall WFL for tasks assessed    Cervical / Trunk Assessment Cervical / Trunk Assessment: Normal  Communication   Communication: No difficulties  Cognition Arousal/Alertness: Awake/alert Behavior During Therapy: WFL for tasks assessed/performed Overall Cognitive Status: Within Functional Limits for tasks assessed                                        General Comments      Exercises Total Joint Exercises Ankle Circles/Pumps: AROM;15 reps;Supine Quad Sets: AROM;Left;10 reps;Supine Short Arc Quad: AROM;Left;10 reps;Supine Heel Slides: AAROM;15 reps;Left;Supine Hip ABduction/ADduction: AROM;Left;10 reps;Supine Straight Leg Raises: AROM;Left;Supine;5 reps Goniometric ROM: 7-105 degrees P/ROM Left knee flexion; pt in recliner, additional flexion blocked by chair.   Assessment/Plan    PT Assessment All further PT needs can be met in the next venue of care  PT Problem List Decreased strength;Decreased range of motion;Decreased activity tolerance;Decreased balance;Decreased mobility;Decreased cognition       PT Treatment Interventions DME instruction;Balance training;Stair training;Gait training;Functional mobility training;Therapeutic activities;Therapeutic exercise;Patient/family education    PT Goals (Current goals can be found in the Care Plan section)  Acute Rehab PT Goals PT Goal Formulation: All assessment and education complete, DC therapy    Frequency     Barriers to discharge        Co-evaluation               AM-PAC PT "6 Clicks" Mobility  Outcome Measure Help needed turning from your back to your side while in a flat bed without using bedrails?: None Help needed moving from lying on your back to sitting on the side of a flat bed without using bedrails?: None Help needed moving to and from a bed to a chair (including a wheelchair)?: A Little Help needed standing up from a chair using your arms (e.g.,  wheelchair or bedside chair)?: A Little Help needed to walk in hospital room?: A Little Help needed climbing 3-5 steps with a railing? : A Little 6 Click Score: 20    End of Session Equipment Utilized During Treatment: Gait belt Activity Tolerance: Patient tolerated treatment well Patient left: in chair;with family/visitor present;with nursing/sitter in room Nurse Communication: Mobility status PT Visit Diagnosis: Unsteadiness on feet (R26.81);Difficulty in walking, not elsewhere classified (R26.2);Muscle weakness (generalized) (M62.81);Other abnormalities of gait and mobility (R26.89)    Time: 1325-1425 PT Time Calculation (min) (ACUTE ONLY): 60 min   Charges:   PT Evaluation $PT Eval Low Complexity: 1 Low PT Treatments $Gait Training: 8-22 mins $Therapeutic Exercise: 8-22 mins        4:33 PM, 05/09/19 Etta Grandchild, PT, DPT Physical Therapist - [facility]  [phone] ([place])    [place] 05/09/2019, 4:27 PM

## 2019-05-09 NOTE — Op Note (Signed)
05/09/2019  9:21 AM  PATIENT:  Jasmine Sullivan  74 y.o. female  PRE-OPERATIVE DIAGNOSIS:  PRIMARY OSTEOARTHRITIS LEFT KNEE  POST-OPERATIVE DIAGNOSIS:  primary osteoarthritis left knee  PROCEDURE:  Procedure(s): LEFT TOTAL KNEE ARTHROPLASTY (Left)  SURGEON: Leitha Schuller, MD  ASSISTANTS: Cranston Neighbor, PA-C  ANESTHESIA:   spinal  EBL:  No intake/output data recorded.  BLOOD ADMINISTERED:none  DRAINS: none   LOCAL MEDICATIONS USED:  MARCAINE    and OTHER Exparel morphine and Toradol  SPECIMEN:  No Specimen  DISPOSITION OF SPECIMEN:  N/A  COUNTS:  YES  TOURNIQUET:   Total Tourniquet Time Documented: Thigh (Left) - 59 minutes Total: Thigh (Left) - 59 minutes   IMPLANTS: Medacta GM K sphere system with 4+ left femur, 4 tibia, 2 patella, short stem on tibial component and 10 mm insert, all components cemented  DICTATION: .Dragon Dictation  patient was brought to the operating room and afterspinal anesthesiawas obtained,the left eg was prepped and draped in the usual sterile fashion. After patient identification and timeout procedures were completed, midline skin incision was made followed by medial parapatellar arthrotomywith bone-on-bone medial compartment osteoarthritis advanced patellofemoral arthritis and moderate lateral compartment arthritis,partial synovectomy was also carried out. There were grooves in the patella and femoral trochlea secondary to severe osteoarthritis as well as an old loose body in the lateral gutter.  The ACL and fat pad were excised off the anterior into the meniscus in the proximal tibia cutting guide from the [facility] was appliedand theproximal tibia cut carried out. The distal femoral cut was carried out in a similar fashion.  The4+ femoralcutting guide applied with anterior posterior and chamfer cuts made. The posterior horns of the menisci were removed at this point.  Injection of the above medication was carried out after  the femoral and tibial cuts were carried out. The4tibia baseplate trial was placed pinned into position and proximal tibial preparation carried out with drilling hand reaming and the keel punch followed by placement of the65femur and sizing the tibial insert size17millimetergave the best fit with stability and full extension. The distal femoral drill holes were made in the notch cut for the trochlear groove was then carried out with trials were then removed the patella was cut using the patellar cutting guide and it sized to a size2afterdrill holes have been made The tourniquet was raised after the first proximal tibial bone cut secondary to bone bleeding. The knee was irrigated with pulsatile lavage and the bony surfaces dried the tibial component was cemented into place first. Excess cement was removed and the polyethylene insert placed with a torque screw placed with a torque screwdriver tightened. The distal femoral component was placed and the knee was held in extension as the patellar button was clamped into place. After the cement was set, excess cement was removed and the knee was again irrigated thoroughly thoroughly irrigated. The tourniquet was let down and hemostasis checkedwithelectrocautery.The arthrotomy was repaired witha heavy Quill suture, followed by 3-0 V lock subcuticular closure,skin staples,  incisional wound VAC and Polar Care  PLAN OF CARE: Discharge to home after PACU  PATIENT DISPOSITION:  PACU - hemodynamically stable.

## 2019-05-09 NOTE — H&P (Signed)
Chief Complaint   bilateral knee osteoarthritis, left worse than right  History of the Present Illness: Jasmine Sullivan is a 74 y.o. female here to discuss bilateral knee arthroplasties. The patient states she has pain to bilateral knees. The patient states the pain used to wake her at night, but that has now stopped. She remarks she has pain with toileting. She is limited to walking to the grocery store. The patient is otherwise very active.  She has had her my knee CT and is here for left knee replacement   I have reviewed past medical, surgical, social and family history, and allergies as documented in the EMR.  Past Medical History: Past Medical History:  Diagnosis Date  . Arthritis  . Asthma without status asthmaticus, unspecified  . Fibromyalgia  . GERD (gastroesophageal reflux disease)  . Hyperlipidemia  . Hypertension  . Osteoarthritis   Past Surgical History: Past Surgical History:  Procedure Laterality Date  . ARTHROSCOPIC ROTATOR CUFF REPAIR  . cervix surgery  . pelvic reconstruction  bladder , rectum prolapse  . THORACOTOMY W/LUNG WEDGE RESECTION   Past Family History: Family History  Problem Relation Age of Onset  . High blood pressure (Hypertension) Mother  . No Known Problems Father  . High blood pressure (Hypertension) Maternal Grandmother  . Diabetes type II Maternal Grandfather   Medications: Current Outpatient Medications Ordered in Epic  Medication Sig Dispense Refill  . albuterol 90 mcg/actuation inhaler Inhale 2 inhalations into the lungs every 6 (six) hours as needed for Wheezing.  Marland Kitchen aspirin 81 MG EC tablet Take 81 mg by mouth once daily.  . baclofen (LIORESAL) 10 MG tablet TAKE 1 TABLET BY MOUTH AT BEDTIME TWICE A DAY AS NEEDED 1  . calcium citrate-vitamin D3 (CITRACAL+D) 315-200 mg-unit tablet Take 2 tablets by mouth 2 (two) times daily with meals.  . cetirizine (ZYRTEC) 10 MG tablet Take 10 mg by mouth once daily.  . fluocinonide (LIDEX) 0.05 %  external solution EVERY DAY AT BEDTIME 6  . FUROsemide (LASIX) 20 MG tablet TAKE 1 TABLET TWICE A DAY FOR 3 DAYS THEN DAILY AS DIRECTED 0  . montelukast (SINGULAIR) 10 mg tablet Take 10 mg by mouth once daily. 3  . multivitamin with iron-minerals (VITAMINS AND MINERALS) tablet Take by mouth  . omega-3 fatty acids/fish oil 340-1,000 mg capsule Take 1 capsule by mouth once daily.  . raloxifene (EVISTA) 60 mg tablet Take 60 mg by mouth once daily.  . simvastatin (ZOCOR) 10 MG tablet Take 10 mg by mouth nightly.  . triamcinolone 0.1 % cream Apply topically 2 (two) times daily.  . valsartan (DIOVAN) 80 MG tablet Take 80 mg by mouth once daily. 3   No current Epic-ordered facility-administered medications on file.   Allergies: Allergies  Allergen Reactions  . Brinzolamide-Brimonidine Other (See Comments)  Dry mouth, itching eyes, "eczema eyes" - tolerates with Pazeo eye drops  . Chlorhexidine Rash  . Erythromycin Other (See Comments)  Stomach issues  . Norvasc [Amlodipine] Rash  . Paxil [Paroxetine Hcl] Itching  . Tessalon [Benzonatate] Rash    Body mass index is 25.45 kg/m.  Review of Systems: A comprehensive 14 point ROS was performed, reviewed, and the pertinent orthopaedic findings are documented in the HPI.  General Physical Examination:  General/Constitutional: No apparent distress: well-nourished and well developed. Eyes: Pupils equal, round with synchronous movement. Lungs: Clear to auscultation HEENT: Normal Vascular: No edema, swelling or tenderness, except as noted in detailed exam. Cardiac: Heart rate and rhythm  is regular. Integumentary: No impressive skin lesions present, except as noted in detailed exam. Neuro/Psych: Normal mood and affect, oriented to person, place and time.  Musculoskeletal Examination: Lungs are clear. Heart rate and rhythm is normal. HEENT is normal.  On exam, she has a slight knee flexion contracture of about 5 degrees bilaterally. She has  left hip range of motion of 25 degrees internal rotation and 75 degrees external rotation. On the right, she has 40 degrees internal rotation and 60 degrees external rotation. Marked anterior right knee crepitation . Marked left knee crepitation at the patellofemoral joint and 125 degrees of flexion.   Radiographs: Her last x-rays were obtained on 10/11/2017, and on the AP standing view of the right knee, there is osteophyte formation medially and laterally, subchondral sclerosis medially and laterally with subchondral cysts in the lateral femoral condyle. On the lateral view, there is extensive patellofemoral arthritis with significant bone loss of the femoral trochlea and patella. Sunrise view shows significant erosion of the lateral facet of the patella, as well as large osteophytes. Left knee is similar with worse patellofemoral disease and move grooving of the patellofemoral joint and worse medial compartment with essentially no joint space.   X-ray Impression  From 09/2017, severe bilateral knee osteoarthritis.   Assessment: ICD-10-CM  1. Primary osteoarthritis of both knees M17.0   Plan: The patient has clinical findings of severe bilateral knee osteoarthritis.    She is admitted today for outpatient left total knee replacement. Surgical Risks:  The nature of the condition and the proposed procedure has been reviewed in detail with the patient. Surgical versus non-surgical options and prognosis for recovery have been reviewed and the inherent risks and benefits of each have been discussed including the risks of infection, bleeding, injury to nerves / blood vessels / tendons, incomplete relief of symptoms, persisting pain and / or stiffness, loss of function, complex regional pain syndrome, failure of procedure, as appropriate.

## 2019-05-09 NOTE — OR Nursing (Signed)
Proceed with nasal betadine prep per Dr Rosita Kea after reviewing allergy to crab.

## 2019-05-09 NOTE — H&P (Signed)
Reviewed paper H+P, will be scanned into chart. No changes noted.  

## 2019-05-09 NOTE — Progress Notes (Signed)
[facility] will be coming to the home. They will contact her within 24 hours per PT coordinator at [facility].

## 2019-05-09 NOTE — Discharge Instructions (Addendum)
Total Knee Replacement, Care After This sheet gives you information about how to care for yourself after your procedure. Your health care provider may also give you more specific instructions. If you have problems or questions, contact your health care provider. What can I expect after the procedure? After the procedure, it is common to have:  Pain.  Swelling.  A small amount of blood or clear fluid coming from your incision.  Limited range of motion. Follow these instructions at home: Medicines  Take over-the-counter and prescription medicines only as told by your health care provider.  If you were prescribed a blood thinner (anticoagulant), take it as told by your health care provider.  Ask your health care provider if the medicine prescribed to you: ? Requires you to avoid driving or using heavy machinery. ? Can cause constipation. You may need to take actions to prevent or treat constipation, such as:  Drink enough fluid to keep your urine pale yellow.  Take over-the-counter or prescription medicines.  Eat foods that are high in fiber, such as beans, whole grains, and fresh fruits and vegetables.  Limit foods that are high in fat and processed sugars, such as fried or sweet foods. Bathing  Do not take baths, swim, or use a hot tub until your health care provider approves. Ask your health care provider if you may take showers. You may only be allowed to take sponge baths.  Keep your bandage (dressing) dry until your health care provider says it can be removed. Incision care and drain care   Follow instructions from your health care provider about how to take care of your incision. Make sure you: ? Wash your hands with soap and water before and after you change your dressing. If soap and water are not available, use hand sanitizer. ? Change your dressing as told by your health care provider. ? Leave stitches (sutures), skin glue, or adhesive strips in place. These skin  closures may need to stay in place for 2 weeks or longer. If adhesive strip edges start to loosen and curl up, you may trim the loose edges. Do not remove adhesive strips completely unless your health care provider tells you to do that.  Check your incision area and drain site every day for signs of infection. Check for: ? More redness, swelling, or pain. ? More fluid or blood. ? Warmth. ? Pus or a bad smell.  If you have a drain, follow instructions from your health care provider about caring for it. Managing pain, stiffness, and swelling      If directed, put ice on your knee. ? Put ice in a plastic bag or use the icing device (cold flow pad or cryocuff) that you were given. Follow instructions from your health care provider about how to use the icing device. ? Place a towel between your skin and the bag or between your skin and the icing device. ? Leave the ice on for 20 minutes, 2-3 times per day.  If directed, apply heat to the affected area before you exercise. Use the heat source that your health care provider recommends, such as a moist heat pack or a heating pad. ? Place a towel between your skin and the heat source. ? Leave the heat on for 20-30 minutes. ? Remove the heat if your skin turns bright red. This is especially important if you are unable to feel pain, heat, or cold. You may have a greater risk of getting burned.  Move your toes   often to avoid stiffness and to lessen swelling.  Raise (elevate) your leg above the level of your heart while you are sitting or lying down. ? Use several pillows to keep your leg straight. ? Do not put a pillow just under the knee. If the knee is bent for a long time, this may lead to stiffness.  Wear elastic knee support as told by your health care provider. Activity  Rest as told by your health care provider.  Avoid sitting for a long time without moving. Get up to take short walks every 1-2 hours. This is important to improve blood  flow and breathing. Ask for help if you feel weak or unsteady.  Ask your health care provider what activities are safe for you.  Avoid high-impact activities, including running, jumping rope, and jumping jacks.  Do not play contact sports until your health care provider approves.  Do exercises as told by your physical therapist.  If you have been sent home with a continuous passive motion machine, use it as told by your health care provider. Safety   Do not use your leg to support your body weight until your health care provider approves. Use crutches or a walker as told by your health care provider.  Do not drive until your health care provider approves. Ask your health care provider when it is safe to drive. General instructions  Do not use any products that contain nicotine or tobacco, such as cigarettes, e-cigarettes, and chewing tobacco. These can delay healing after surgery. If you need help quitting, ask your health care provider.  Wear compression stockings as told by your health care provider.  Tell your health care provider if you plan to have dental work. Also: ? Tell your dentist about your joint replacement. ? Ask your health care provider if there are any special instructions you need to follow before having dental care and routine cleanings.  Keep all follow-up visits as told by your health care provider. This is important. Contact a health care provider if you have:  More redness, swelling, or pain around your incision or drain.  More fluid or blood coming from your incision or drain.  Pus or a bad smell coming from your incision or drain.  Warmth on your incision or drain site.  A fever.  An incision that breaks open.  Knee pain that does not go away.  Range of motion in your knee that is getting worse.  A prosthesis that feels loose. Get help right away if you have:  Pain or swelling in your calf or thigh.  Shortness of breath or difficulty  breathing.  Chest pain. Summary  After the procedure, it is common to have pain and swelling, blood or fluid coming from your incision, and limited range of motion.  Follow instructions from your health care provider about how to take care of your incision.  Use crutches or a walker as told by your health care provider.  If you were prescribed a blood thinner (anticoagulant), take it as told by your health care provider.  Keep all follow-up visits as told by your health care provider. This is important. This information is not intended to replace advice given to you by your health care provider. Make sure you discuss any questions you have with your health care provider. Document Revised: 08/22/2018 Document Reviewed: 11/25/2017 Elsevier Patient Education  2020 [employer] AMBULATORY SURGERY  DISCHARGE INSTRUCTIONS   1) The drugs that you were given will stay in   your system until tomorrow so for the next 24 hours you should not:  A) Drive an automobile B) Make any legal decisions C) Drink any alcoholic beverage   2) You may resume regular meals tomorrow.  Today it is better to start with liquids and gradually work up to solid foods.  You may eat anything you prefer, but it is better to start with liquids, then soup and crackers, and gradually work up to solid foods.   3) Please notify your doctor immediately if you have any unusual bleeding, trouble breathing, redness and pain at the surgery site, drainage, fever, or pain not relieved by medication.    4) Additional Instructions:     Please contact your physician with any problems or Same Day Surgery at [phone], Monday through Friday 6 am to 4 pm, or [place] [facility] number at [phone]. 

## 2019-05-09 NOTE — OR Nursing (Signed)
Dr Priscella Mann in to see patient reviewed ekg from 03/30/19.  No new orders also made aware of blood sugar of 247 this am.

## 2019-05-09 NOTE — Anesthesia Procedure Notes (Signed)
Spinal  Patient location during procedure: OR Start time: 05/09/2019 7:22 AM End time: 05/09/2019 7:31 AM Staffing Performed: resident/CRNA and anesthesiologist  Anesthesiologist: Emmie Niemann, MD Resident/CRNA: Nelda Marseille, CRNA Preanesthetic Checklist Completed: patient identified, IV checked, site marked, risks and benefits discussed, surgical consent, monitors and equipment checked, pre-op evaluation and timeout performed Spinal Block Patient position: sitting Prep: Betadine Patient monitoring: heart rate, continuous pulse ox and blood pressure Approach: midline Location: L4-5 Injection technique: single-shot Needle Needle type: Introducer and Pencil-Tip  Needle gauge: 24 G Needle length: 9 cm Additional Notes Negative paresthesia. Negative blood return. Positive free-flowing CSF. Expiration date of kit checked and confirmed. Patient tolerated procedure well, without complications.

## 2019-05-09 NOTE — Anesthesia Preprocedure Evaluation (Signed)
Anesthesia Evaluation  Patient identified by MRN, date of birth, ID band Patient awake    Reviewed: Allergy & Precautions, NPO status , Patient's Chart, lab work & pertinent test results  History of Anesthesia Complications Negative for: history of anesthetic complications  Airway Mallampati: II  TM Distance: >3 FB Neck ROM: Full    Dental  (+) Poor Dentition   Pulmonary asthma , neg sleep apnea, COPD,  COPD inhaler, former smoker,    breath sounds clear to auscultation- rhonchi (-) wheezing      Cardiovascular hypertension, Pt. on medications (-) CAD, (-) Past MI, (-) Cardiac Stents and (-) CABG  Rhythm:Regular Rate:Normal - Systolic murmurs and - Diastolic murmurs    Neuro/Psych neg Seizures negative neurological ROS  negative psych ROS   GI/Hepatic negative GI ROS, Neg liver ROS,   Endo/Other  negative endocrine ROSneg diabetes  Renal/GU negative Renal ROS     Musculoskeletal  (+) Arthritis ,   Abdominal (+) - obese,   Peds  Hematology negative hematology ROS (+)   Anesthesia Other Findings Past Medical History: No date: Arthritis No date: Asthma No date: COPD (chronic obstructive pulmonary disease) (HCC) No date: Dyspnea     Comment:  DOE No date: Fatty liver No date: Heart murmur 2000: History of methicillin resistant staphylococcus aureus (MRSA) No date: Hypertension   Reproductive/Obstetrics                             Lab Results  Component Value Date   WBC 10.0 03/30/2019   HGB 15.0 05/09/2019   HCT 44.0 05/09/2019   MCV 91.2 03/30/2019   PLT 248 03/30/2019    Anesthesia Physical Anesthesia Plan  ASA: II  Anesthesia Plan: Spinal   Post-op Pain Management:    Induction:   PONV Risk Score and Plan: 2 and Propofol infusion  Airway Management Planned: Natural Airway  Additional Equipment:   Intra-op Plan:   Post-operative Plan:   Informed Consent: I  have reviewed the patients History and Physical, chart, labs and discussed the procedure including the risks, benefits and alternatives for the proposed anesthesia with the patient or authorized representative who has indicated his/her understanding and acceptance.     Dental advisory given  Plan Discussed with: CRNA and Anesthesiologist  Anesthesia Plan Comments:         Anesthesia Quick Evaluation

## 2019-05-09 NOTE — Anesthesia Postprocedure Evaluation (Signed)
Anesthesia Post Note  Patient: Jasmine Sullivan  Procedure(s) Performed: LEFT TOTAL KNEE ARTHROPLASTY (Left Knee)  Patient location during evaluation: PACU Anesthesia Type: Spinal Level of consciousness: oriented and awake and alert Pain management: pain level controlled Vital Signs Assessment: post-procedure vital signs reviewed and stable Respiratory status: spontaneous breathing, respiratory function stable and nonlabored ventilation Cardiovascular status: blood pressure returned to baseline and stable Postop Assessment: no headache, no backache and spinal receding Anesthetic complications: no     Last Vitals:  Vitals:   05/09/19 1130 05/09/19 1135  BP: (!) (P) 143/69   Pulse: 67 75  Resp: (!) 22 14  Temp:    SpO2: (!) 89% 97%    Last Pain:  Vitals:   05/09/19 1110  PainSc: 1                  Zacharias Ridling

## 2019-05-09 NOTE — OR Nursing (Signed)
Patient instructed on use of incentive spirometer with return demonstration. 

## 2019-05-11 ENCOUNTER — Other Ambulatory Visit: Payer: Self-pay | Admitting: Orthopedic Surgery

## 2019-05-11 ENCOUNTER — Ambulatory Visit
Admission: RE | Admit: 2019-05-11 | Discharge: 2019-05-11 | Disposition: A | Discharge status: Home / Self Care | Payer: Medicare PPO | Source: Ambulatory Visit | Attending: Orthopedic Surgery | Admitting: Orthopedic Surgery

## 2019-05-11 DIAGNOSIS — M79605 Pain in left leg: Secondary | ICD-10-CM | POA: Diagnosis present

## 2019-05-24 ENCOUNTER — Other Ambulatory Visit: Payer: Self-pay | Admitting: Orthopedic Surgery

## 2019-05-24 ENCOUNTER — Ambulatory Visit
Admission: RE | Admit: 2019-05-24 | Discharge: 2019-05-24 | Disposition: A | Discharge status: Home / Self Care | Payer: Medicare PPO | Source: Ambulatory Visit | Attending: Orthopedic Surgery | Admitting: Orthopedic Surgery

## 2019-05-24 ENCOUNTER — Other Ambulatory Visit: Payer: Self-pay

## 2019-05-24 DIAGNOSIS — M79605 Pain in left leg: Secondary | ICD-10-CM | POA: Diagnosis present

## 2019-05-24 DIAGNOSIS — Z96652 Presence of left artificial knee joint: Secondary | ICD-10-CM

## 2019-05-24 DIAGNOSIS — R609 Edema, unspecified: Secondary | ICD-10-CM | POA: Diagnosis present

## 2019-05-25 ENCOUNTER — Ambulatory Visit: Payer: Medicare PPO

## 2019-08-28 ENCOUNTER — Other Ambulatory Visit: Payer: Self-pay | Admitting: Internal Medicine

## 2019-08-28 DIAGNOSIS — Z1231 Encounter for screening mammogram for malignant neoplasm of breast: Secondary | ICD-10-CM

## 2019-09-28 ENCOUNTER — Other Ambulatory Visit: Payer: Self-pay

## 2019-09-28 ENCOUNTER — Ambulatory Visit (INDEPENDENT_AMBULATORY_CARE_PROVIDER_SITE_OTHER): Payer: Medicare PPO | Admitting: Vascular Surgery

## 2019-09-28 ENCOUNTER — Encounter (INDEPENDENT_AMBULATORY_CARE_PROVIDER_SITE_OTHER): Payer: Self-pay | Admitting: Vascular Surgery

## 2019-09-28 DIAGNOSIS — M179 Osteoarthritis of knee, unspecified: Secondary | ICD-10-CM | POA: Insufficient documentation

## 2019-09-28 DIAGNOSIS — I872 Venous insufficiency (chronic) (peripheral): Secondary | ICD-10-CM | POA: Diagnosis not present

## 2019-09-28 DIAGNOSIS — E785 Hyperlipidemia, unspecified: Secondary | ICD-10-CM | POA: Insufficient documentation

## 2019-09-28 DIAGNOSIS — I890 Lymphedema, not elsewhere classified: Secondary | ICD-10-CM | POA: Diagnosis not present

## 2019-09-28 DIAGNOSIS — E782 Mixed hyperlipidemia: Secondary | ICD-10-CM | POA: Diagnosis not present

## 2019-09-28 DIAGNOSIS — M1710 Unilateral primary osteoarthritis, unspecified knee: Secondary | ICD-10-CM | POA: Diagnosis not present

## 2019-09-28 NOTE — Progress Notes (Signed)
MRN : [id number]  Jasmine Sullivan is a 74 y.o. ([birth year]) female who presents with chief complaint of No chief complaint on file. Marland Kitchen  History of Present Illness:   Patient is seen for evaluation of leg swelling. The patient first noticed the swelling remotely but is now concerned because of a significant increase in the overall edema, this increase seems to have corresponded with her left total knee replacement performed 05/09/2019.  She also notes that at the time of her knee replacement her Baker's cyst was "ruptured" and that the fluid moved into her calf causing significant inflammation and discomfort as well as swelling.  The swelling is associated with pain and some discoloration. The patient notes that in the morning the legs are significantly improved but they steadily worsened throughout the course of the day. Elevation makes the legs better, dependency makes them much worse.   There is no history of ulcerations associated with the swelling.   The patient denies any recent changes in their medications.  The patient has been wearing graduated compression since Dr. Rudene Christians recommended it.  She is wearing 20 to 30 mmHg knee-high socks..  The patient has no had any past angiography, interventions or vascular surgery.  The patient denies a history of DVT or PE.  This has been verified by several ultrasounds.  There is no prior history of phlebitis. There is no history of severe primary lymphedema.  There is no history of radiation treatment to the groin or pelvis No history of malignancies. No history of trauma or groin or pelvic surgery. No history of foreign travel or parasitic infections area    No outpatient medications have been marked as taking for the 09/28/19 encounter (Appointment) with Delana Meyer, Dolores Lory, MD.    Past Medical History:  Diagnosis Date  . Arthritis   . Asthma   . COPD (chronic obstructive pulmonary disease) ([place])   . Dyspnea    DOE  . Fatty liver   .  Heart murmur   . History of methicillin resistant staphylococcus aureus (MRSA) 2000  . Hypertension     Past Surgical History:  Procedure Laterality Date  . CATARACT EXTRACTION W/PHACO Right 03/23/2017   Procedure: CATARACT EXTRACTION PHACO AND INTRAOCULAR LENS PLACEMENT (IOC);  Surgeon: Birder Robson, MD;  Location: [facility] ORS;  Service: Ophthalmology;  Laterality: Right;  [place] 00:36.7 AP% 13.2 CDE 4.84 Fluid Pack lot # [id number]  . CHOLECYSTECTOMY    . DILATION AND CURETTAGE OF UTERUS    . SHOULER Left    rotator cuff  . THORACOTOMY     WEDGE RESECTION  . TOTAL KNEE ARTHROPLASTY Left 05/09/2019   Procedure: LEFT TOTAL KNEE ARTHROPLASTY;  Surgeon: Hessie Knows, MD;  Location: [facility] ORS;  Service: Orthopedics;  Laterality: Left;    Social History Social History   Tobacco Use  . Smoking status: Former Research scientist (life sciences)  . Smokeless tobacco: Never Used  . Tobacco comment: 45 years ago  Substance Use Topics  . Alcohol use: Yes    Alcohol/week: 14.0 standard drinks    Types: 14 Cans of beer per week  . Drug use: Never    Family History Family History  Problem Relation Age of Onset  . Breast cancer Mother 64  No family history of bleeding/clotting disorders, porphyria or autoimmune disease   Allergies  Allergen Reactions  . Paroxetine Hcl Itching  . Simbrinza [Brinzolamide-Brimonidine] Other (See Comments)    Dry mouth, itching eyes, "eczema eyes" - tolerates with [place] eye drops  .  Benadryl [Diphenhydramine]     Glaucoma (caused optic nerve damage)  . Crab [Shellfish Allergy] Swelling    And itching  . Amlodipine Rash  . Benzonatate Rash  . Chlorhexidine Rash  . Erythromycin Other (See Comments)    Stomach issues     REVIEW OF SYSTEMS (Negative unless checked)  Constitutional: [] Weight loss  [] Fever  [] Chills Cardiac: [] Chest pain   [] Chest pressure   [] Palpitations   [] Shortness of breath when laying flat   [] Shortness of breath with exertion. Vascular:  [x] Pain in  legs with walking   [x] Pain in legs at rest  [] History of DVT   [] Phlebitis   [x] Swelling in legs   [] Varicose veins   [] Non-healing ulcers Pulmonary:   [] Uses home oxygen   [] Productive cough   [] Hemoptysis   [] Wheeze  [] COPD   [] Asthma Neurologic:  [] Dizziness   [] Seizures   [] History of stroke   [] History of TIA  [] Aphasia   [] Vissual changes   [] Weakness or numbness in arm   [] Weakness or numbness in leg Musculoskeletal:   [x] Joint swelling   [x] Joint pain   [] Low back pain Hematologic:  [] Easy bruising  [] Easy bleeding   [] Hypercoagulable state   [] Anemic Gastrointestinal:  [] Diarrhea   [] Vomiting  [] Gastroesophageal reflux/heartburn   [] Difficulty swallowing. Genitourinary:  [] Chronic kidney disease   [] Difficult urination  [] Frequent urination   [] Blood in urine Skin:  [] Rashes   [] Ulcers  Psychological:  [] History of anxiety   []  History of major depression.  Physical Examination  There were no vitals filed for this visit. There is no height or weight on file to calculate BMI. Gen: WD/WN, NAD Head: [place]/AT, No temporalis wasting.  Ear/Nose/Throat: Hearing grossly intact, nares w/o erythema or drainage, poor dentition Eyes: PER, EOMI, sclera nonicteric.  Neck: Supple, no masses.  No bruit or JVD.  Pulmonary:  Good air movement, clear to auscultation bilaterally, no use of accessory muscles.  Cardiac: RRR, normal S1, S2, no Murmurs. Vascular: scattered varicosities present bilaterally.  Mild venous stasis changes to the legs bilaterally.  2-3+ soft pitting edema left ankle. Vessel Right Left  Radial Palpable Palpable  PT Palpable Palpable  DP Palpable Palpable  Gastrointestinal: soft, non-distended. No guarding/no peritoneal signs.  Musculoskeletal: M/S 5/5 throughout.  No deformity or atrophy.  Neurologic: CN 2-12 intact. Pain and light touch intact in extremities.  Symmetrical.  Speech is fluent. Motor exam as listed above. Psychiatric: Judgment intact, Mood & affect appropriate  for pt's clinical situation. Dermatologic: Mild venous rashes no ulcers noted.  No changes consistent with cellulitis.  CBC Lab Results  Component Value Date   WBC 7.8 05/09/2019   HGB 13.4 05/09/2019   HCT 41.8 05/09/2019   MCV 92.7 05/09/2019   PLT 222 05/09/2019    BMET    Component Value Date/Time   NA 140 05/09/2019 0632   NA 137 06/01/2012 0517   K 3.5 05/09/2019 0632   K 3.8 06/01/2012 0517   CL 105 05/09/2019 0632   CL 103 06/01/2012 0517   CO2 27 03/30/2019 1030   CO2 28 06/01/2012 0517   GLUCOSE 247 (H) 05/09/2019 0632   GLUCOSE 122 (H) 06/01/2012 0517   BUN 10 05/09/2019 0632   BUN 12 06/01/2012 0517   CREATININE 0.70 05/09/2019 1031   CREATININE 0.77 06/01/2012 0517   CALCIUM 9.1 03/30/2019 1030   CALCIUM 8.5 06/01/2012 0517   GFRNONAA >60 05/09/2019 1031   GFRNONAA >60 06/01/2012 0517   GFRAA >60 05/09/2019 1031  GFRAA >60 06/01/2012 0517   CrCl cannot be calculated (Patient's most recent lab result is older than the maximum 21 days allowed.).  COAG Lab Results  Component Value Date   INR 1.1 03/30/2019   INR 1.0 05/23/2012   INR 1.0 05/17/2012    Radiology No results found.   Assessment/Plan 1. Lymphedema I have had a long discussion with the patient regarding swelling and why it  causes symptoms.  Patient will begin wearing graduated compression stockings class 1 (20-30 mmHg) on a daily basis a prescription was given. The patient will  beginning wearing the stockings first thing in the morning and removing them in the evening. The patient is instructed specifically not to sleep in the stockings.   In addition, behavioral modification will be initiated.  This will include frequent elevation, use of over the counter pain medications and exercise such as walking.  I have reviewed systemic causes for chronic edema such as liver, kidney and cardiac etiologies.  The patient denies problems with these organ systems.    Consideration for a lymph pump  will also be made based upon the effectiveness of conservative therapy.  This would help to improve the edema control and prevent sequela such as ulcers and infections   Patient's duplex ultrasound of the venous system negative for DVT.  The patient will follow-up with me after the ultrasound.    2. Chronic venous insufficiency I have had a long discussion with the patient regarding swelling and why it  causes symptoms.  Patient will begin wearing graduated compression stockings class 1 (20-30 mmHg) on a daily basis a prescription was given. The patient will  beginning wearing the stockings first thing in the morning and removing them in the evening. The patient is instructed specifically not to sleep in the stockings.   In addition, behavioral modification will be initiated.  This will include frequent elevation, use of over the counter pain medications and exercise such as walking.  I have reviewed systemic causes for chronic edema such as liver, kidney and cardiac etiologies.  The patient denies problems with these organ systems.    Consideration for a lymph pump will also be made based upon the effectiveness of conservative therapy.  This would help to improve the edema control and prevent sequela such as ulcers and infections   Patient's duplex ultrasound of the venous system negative for DVT.  The patient will follow-up with me in about 3 months.    3. Primary osteoarthritis of knee, unspecified laterality Continue NSAID medications as already ordered, these medications have been reviewed and there are no changes at this time.  Continued activity and therapy was stressed.   4. Mixed hyperlipidemia Continue statin as ordered and reviewed, no changes at this time    Levora Dredge, MD  09/28/2019 8:30 AM

## 2019-11-24 ENCOUNTER — Ambulatory Visit
Admission: RE | Admit: 2019-11-24 | Discharge: 2019-11-24 | Disposition: A | Discharge status: Home / Self Care | Payer: Medicare PPO | Source: Ambulatory Visit | Attending: Internal Medicine | Admitting: Internal Medicine

## 2019-11-24 ENCOUNTER — Other Ambulatory Visit: Payer: Self-pay

## 2019-11-24 DIAGNOSIS — Z1231 Encounter for screening mammogram for malignant neoplasm of breast: Secondary | ICD-10-CM | POA: Diagnosis present

## 2019-12-26 ENCOUNTER — Encounter (INDEPENDENT_AMBULATORY_CARE_PROVIDER_SITE_OTHER): Payer: Self-pay

## 2019-12-28 ENCOUNTER — Ambulatory Visit (INDEPENDENT_AMBULATORY_CARE_PROVIDER_SITE_OTHER): Payer: Medicare PPO | Admitting: Vascular Surgery

## 2020-10-22 DIAGNOSIS — Z96652 Presence of left artificial knee joint: Secondary | ICD-10-CM | POA: Diagnosis not present

## 2020-10-22 DIAGNOSIS — M25562 Pain in left knee: Secondary | ICD-10-CM | POA: Diagnosis not present

## 2020-10-22 DIAGNOSIS — M1711 Unilateral primary osteoarthritis, right knee: Secondary | ICD-10-CM | POA: Diagnosis not present

## 2021-03-14 DIAGNOSIS — E782 Mixed hyperlipidemia: Secondary | ICD-10-CM | POA: Diagnosis not present

## 2021-03-14 DIAGNOSIS — I10 Essential (primary) hypertension: Secondary | ICD-10-CM | POA: Diagnosis not present

## 2021-03-14 DIAGNOSIS — N3941 Urge incontinence: Secondary | ICD-10-CM | POA: Diagnosis not present

## 2021-03-14 DIAGNOSIS — R350 Frequency of micturition: Secondary | ICD-10-CM | POA: Diagnosis not present

## 2021-03-17 DIAGNOSIS — R7302 Impaired glucose tolerance (oral): Secondary | ICD-10-CM | POA: Diagnosis not present

## 2021-03-17 DIAGNOSIS — E782 Mixed hyperlipidemia: Secondary | ICD-10-CM | POA: Diagnosis not present

## 2021-03-17 DIAGNOSIS — I10 Essential (primary) hypertension: Secondary | ICD-10-CM | POA: Diagnosis not present

## 2021-03-19 ENCOUNTER — Other Ambulatory Visit: Payer: Self-pay | Admitting: Internal Medicine

## 2021-03-19 DIAGNOSIS — R102 Pelvic and perineal pain: Secondary | ICD-10-CM

## 2021-03-24 ENCOUNTER — Other Ambulatory Visit: Payer: Self-pay

## 2021-03-24 ENCOUNTER — Ambulatory Visit
Admission: RE | Admit: 2021-03-24 | Discharge: 2021-03-24 | Disposition: A | Discharge status: Home / Self Care | Payer: Medicare PPO | Source: Ambulatory Visit | Attending: Internal Medicine | Admitting: Internal Medicine

## 2021-03-24 DIAGNOSIS — R102 Pelvic and perineal pain: Secondary | ICD-10-CM | POA: Insufficient documentation

## 2021-03-24 DIAGNOSIS — Z780 Asymptomatic menopausal state: Secondary | ICD-10-CM | POA: Diagnosis not present

## 2021-03-24 DIAGNOSIS — N888 Other specified noninflammatory disorders of cervix uteri: Secondary | ICD-10-CM | POA: Diagnosis not present

## 2021-03-28 ENCOUNTER — Other Ambulatory Visit: Payer: Self-pay | Admitting: Internal Medicine

## 2021-03-28 DIAGNOSIS — E782 Mixed hyperlipidemia: Secondary | ICD-10-CM | POA: Diagnosis not present

## 2021-03-28 DIAGNOSIS — N3941 Urge incontinence: Secondary | ICD-10-CM | POA: Diagnosis not present

## 2021-03-28 DIAGNOSIS — I10 Essential (primary) hypertension: Secondary | ICD-10-CM | POA: Diagnosis not present

## 2021-03-28 DIAGNOSIS — R7302 Impaired glucose tolerance (oral): Secondary | ICD-10-CM | POA: Diagnosis not present

## 2021-03-28 DIAGNOSIS — Z1231 Encounter for screening mammogram for malignant neoplasm of breast: Secondary | ICD-10-CM

## 2021-04-15 ENCOUNTER — Other Ambulatory Visit: Payer: Self-pay

## 2021-04-15 ENCOUNTER — Ambulatory Visit
Admission: RE | Admit: 2021-04-15 | Discharge: 2021-04-15 | Disposition: A | Discharge status: Home / Self Care | Payer: Medicare PPO | Source: Ambulatory Visit | Attending: Internal Medicine | Admitting: Internal Medicine

## 2021-04-15 DIAGNOSIS — Z1231 Encounter for screening mammogram for malignant neoplasm of breast: Secondary | ICD-10-CM | POA: Diagnosis not present
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
## 2021-05-23 ENCOUNTER — Encounter: Payer: Self-pay | Admitting: Obstetrics and Gynecology

## 2021-05-23 ENCOUNTER — Ambulatory Visit: Payer: Medicare PPO | Admitting: Obstetrics and Gynecology

## 2021-05-23 ENCOUNTER — Other Ambulatory Visit: Payer: Self-pay

## 2021-05-23 VITALS — BP 158/70 | HR 86 | Ht 65.0 in | Wt 160.0 lb

## 2021-05-23 DIAGNOSIS — N393 Stress incontinence (female) (male): Secondary | ICD-10-CM | POA: Diagnosis not present

## 2021-05-23 DIAGNOSIS — N3281 Overactive bladder: Secondary | ICD-10-CM

## 2021-05-23 DIAGNOSIS — R350 Frequency of micturition: Secondary | ICD-10-CM

## 2021-05-23 DIAGNOSIS — N816 Rectocele: Secondary | ICD-10-CM

## 2021-05-23 LAB — POCT URINALYSIS DIPSTICK
Appearance: NORMAL
Bilirubin, UA: NEGATIVE
Glucose, UA: NEGATIVE
Ketones, UA: NEGATIVE
Leukocytes, UA: NEGATIVE
Nitrite, UA: NEGATIVE
Protein, UA: NEGATIVE
Spec Grav, UA: 1.015 (ref 1.010–1.025)
Urobilinogen, UA: 0.2 E.U./dL
pH, UA: 6 (ref 5.0–8.0)

## 2021-05-23 MED ORDER — VIBEGRON 75 MG PO TABS: 75.0000 mg | ORAL_TABLET | Freq: Every day | ORAL | 5 refills | Status: DC

## 2021-05-23 NOTE — Patient Instructions (Signed)
We discussed the symptoms of overactive bladder (OAB), which include urinary urgency, urinary frequency, night-time urination, with or without urge incontinence.  We discussed management including behavioral therapy (decreasing bladder irritants by following a bladder diet, urge suppression strategies, timed voids, bladder retraining), physical therapy, medication; and for refractory cases posterior tibial nerve stimulation, sacral neuromodulation, and intravesical botulinum toxin injection.  

## 2021-05-30 DIAGNOSIS — L219 Seborrheic dermatitis, unspecified: Secondary | ICD-10-CM | POA: Diagnosis not present

## 2021-05-30 DIAGNOSIS — R7302 Impaired glucose tolerance (oral): Secondary | ICD-10-CM | POA: Diagnosis not present

## 2021-05-30 DIAGNOSIS — E782 Mixed hyperlipidemia: Secondary | ICD-10-CM | POA: Diagnosis not present

## 2021-05-30 DIAGNOSIS — I10 Essential (primary) hypertension: Secondary | ICD-10-CM | POA: Diagnosis not present

## 2021-07-07 ENCOUNTER — Ambulatory Visit: Payer: Medicare PPO | Admitting: Obstetrics and Gynecology

## 2021-07-07 NOTE — Progress Notes (Deleted)
[facility] ?Return Visit ? ?SUBJECTIVE  ?History of Present Illness: ?Jasmine Sullivan is a 76 y.o. female seen in follow-up for overactive bladder. Plan at last visit was to start [facility] 75mg  daily.  ? ? ? ?Past Medical History: ?Patient  has a past medical history of Arthritis, Asthma, COPD (chronic obstructive pulmonary disease) (HCC), Dyspnea, Fatty liver, Glaucoma, Heart murmur, History of methicillin resistant staphylococcus aureus (MRSA) (2000), and Hypertension.  ? ?Past Surgical History: ?She  has a past surgical history that includes Cholecystectomy; SHOULER (Left); Thoracotomy; Dilation and curettage of uterus; Cataract extraction w/PHACO (Right, 03/23/2017); and Total knee arthroplasty (Left, 05/09/2019).  ? ?Medications: ?She has a current medication list which includes the following prescription(s): albuterol, aspirin ec, calcium carb-cholecalciferol, cetirizine, clotrimazole-betamethasone, furosemide, gabapentin, montelukast, fish oil, systane balance, raloxifene, simvastatin, triamcinolone cream, valsartan, and vibegron.  ? ?Allergies: ?Patient is allergic to paroxetine hcl, simbrinza [brinzolamide-brimonidine], benadryl [diphenhydramine], crab [shellfish allergy], amlodipine, benzonatate, chlorhexidine, and erythromycin.  ? ?Social History: ?Patient  reports that she has quit smoking. She has never used smokeless tobacco. She reports current alcohol use of about 14.0 standard drinks per week. She reports that she does not use drugs.  ?  ?  ?OBJECTIVE  ?  ? ?Physical Exam: ?There were no vitals filed for this visit. ?Gen: No apparent distress, A&O x 3. ? ?Detailed Urogynecologic Evaluation:  ?Deferred. Prior exam showed: ? ?No flowsheet data found.    ? ?ASSESSMENT AND PLAN  ?  ?[name] is a 76 y.o. with:  ?No diagnosis found. ? ?

## 2021-07-15 DIAGNOSIS — N958 Other specified menopausal and perimenopausal disorders: Secondary | ICD-10-CM | POA: Diagnosis not present

## 2021-07-15 DIAGNOSIS — R29890 Loss of height: Secondary | ICD-10-CM | POA: Diagnosis not present

## 2021-08-06 DIAGNOSIS — H401232 Low-tension glaucoma, bilateral, moderate stage: Secondary | ICD-10-CM | POA: Diagnosis not present

## 2021-08-07 DIAGNOSIS — H5213 Myopia, bilateral: Secondary | ICD-10-CM | POA: Diagnosis not present

## 2021-09-26 DIAGNOSIS — Z1151 Encounter for screening for human papillomavirus (HPV): Secondary | ICD-10-CM | POA: Diagnosis not present

## 2021-09-26 DIAGNOSIS — R7302 Impaired glucose tolerance (oral): Secondary | ICD-10-CM | POA: Diagnosis not present

## 2021-09-26 DIAGNOSIS — N760 Acute vaginitis: Secondary | ICD-10-CM | POA: Diagnosis not present

## 2021-09-26 DIAGNOSIS — B977 Papillomavirus as the cause of diseases classified elsewhere: Secondary | ICD-10-CM | POA: Diagnosis not present

## 2021-09-26 DIAGNOSIS — Z113 Encounter for screening for infections with a predominantly sexual mode of transmission: Secondary | ICD-10-CM | POA: Diagnosis not present

## 2021-09-26 DIAGNOSIS — I10 Essential (primary) hypertension: Secondary | ICD-10-CM | POA: Diagnosis not present

## 2021-09-26 DIAGNOSIS — E782 Mixed hyperlipidemia: Secondary | ICD-10-CM | POA: Diagnosis not present

## 2021-09-26 DIAGNOSIS — Z112 Encounter for screening for other bacterial diseases: Secondary | ICD-10-CM | POA: Diagnosis not present

## 2021-09-29 DIAGNOSIS — I10 Essential (primary) hypertension: Secondary | ICD-10-CM | POA: Diagnosis not present

## 2021-09-29 DIAGNOSIS — E782 Mixed hyperlipidemia: Secondary | ICD-10-CM | POA: Diagnosis not present

## 2021-09-29 DIAGNOSIS — R7302 Impaired glucose tolerance (oral): Secondary | ICD-10-CM | POA: Diagnosis not present

## 2021-10-01 DIAGNOSIS — M25562 Pain in left knee: Secondary | ICD-10-CM | POA: Diagnosis not present

## 2021-10-01 DIAGNOSIS — Z96652 Presence of left artificial knee joint: Secondary | ICD-10-CM | POA: Diagnosis not present

## 2021-10-03 ENCOUNTER — Other Ambulatory Visit: Payer: Self-pay | Admitting: Orthopedic Surgery

## 2021-10-03 DIAGNOSIS — M25562 Pain in left knee: Secondary | ICD-10-CM

## 2021-10-03 DIAGNOSIS — Z96652 Presence of left artificial knee joint: Secondary | ICD-10-CM

## 2021-10-08 DIAGNOSIS — H401232 Low-tension glaucoma, bilateral, moderate stage: Secondary | ICD-10-CM | POA: Diagnosis not present

## 2021-10-09 ENCOUNTER — Encounter
Admission: RE | Admit: 2021-10-09 | Discharge: 2021-10-09 | Disposition: A | Discharge status: Home / Self Care | Payer: Medicare PPO | Source: Ambulatory Visit | Attending: Orthopedic Surgery | Admitting: Orthopedic Surgery

## 2021-10-09 DIAGNOSIS — Z96652 Presence of left artificial knee joint: Secondary | ICD-10-CM | POA: Insufficient documentation

## 2021-10-09 DIAGNOSIS — M25462 Effusion, left knee: Secondary | ICD-10-CM | POA: Diagnosis not present

## 2021-10-09 DIAGNOSIS — M25562 Pain in left knee: Secondary | ICD-10-CM | POA: Diagnosis not present

## 2021-10-09 MED ORDER — TECHNETIUM TC 99M MEDRONATE IV KIT
20.0600 | PACK | Freq: Once | INTRAVENOUS | Status: AC | PRN
  Administered 2021-10-09: 20.06 via INTRAVENOUS

## 2021-10-14 ENCOUNTER — Other Ambulatory Visit: Payer: Medicare PPO

## 2021-10-15 DIAGNOSIS — H401232 Low-tension glaucoma, bilateral, moderate stage: Secondary | ICD-10-CM | POA: Diagnosis not present

## 2021-10-21 DIAGNOSIS — M25562 Pain in left knee: Secondary | ICD-10-CM | POA: Diagnosis not present

## 2021-10-21 DIAGNOSIS — Z96652 Presence of left artificial knee joint: Secondary | ICD-10-CM | POA: Diagnosis not present

## 2021-10-29 DIAGNOSIS — M25462 Effusion, left knee: Secondary | ICD-10-CM | POA: Diagnosis not present

## 2021-10-29 DIAGNOSIS — T8484XD Pain due to internal orthopedic prosthetic devices, implants and grafts, subsequent encounter: Secondary | ICD-10-CM | POA: Diagnosis not present

## 2021-11-14 ENCOUNTER — Other Ambulatory Visit: Payer: Self-pay

## 2021-11-14 ENCOUNTER — Emergency Department: Payer: Medicare PPO

## 2021-11-14 ENCOUNTER — Encounter: Payer: Self-pay | Admitting: Emergency Medicine

## 2021-11-14 ENCOUNTER — Emergency Department
Admission: EM | Admit: 2021-11-14 | Discharge: 2021-11-14 | Disposition: A | Discharge status: Home / Self Care | Payer: Medicare PPO | Attending: Emergency Medicine | Admitting: Emergency Medicine

## 2021-11-14 DIAGNOSIS — M25562 Pain in left knee: Secondary | ICD-10-CM | POA: Diagnosis not present

## 2021-11-14 DIAGNOSIS — R52 Pain, unspecified: Secondary | ICD-10-CM | POA: Diagnosis not present

## 2021-11-14 DIAGNOSIS — J45909 Unspecified asthma, uncomplicated: Secondary | ICD-10-CM | POA: Insufficient documentation

## 2021-11-14 DIAGNOSIS — J449 Chronic obstructive pulmonary disease, unspecified: Secondary | ICD-10-CM | POA: Diagnosis not present

## 2021-11-14 DIAGNOSIS — Z96652 Presence of left artificial knee joint: Secondary | ICD-10-CM | POA: Insufficient documentation

## 2021-11-14 DIAGNOSIS — I10 Essential (primary) hypertension: Secondary | ICD-10-CM | POA: Insufficient documentation

## 2021-11-14 DIAGNOSIS — Z79899 Other long term (current) drug therapy: Secondary | ICD-10-CM | POA: Insufficient documentation

## 2021-11-14 DIAGNOSIS — Z7982 Long term (current) use of aspirin: Secondary | ICD-10-CM | POA: Insufficient documentation

## 2021-11-14 DIAGNOSIS — M25462 Effusion, left knee: Secondary | ICD-10-CM | POA: Diagnosis not present

## 2021-11-14 LAB — CBC WITH DIFFERENTIAL/PLATELET
Abs Immature Granulocytes: 0.05 10*3/uL (ref 0.00–0.07)
Basophils Absolute: 0.1 10*3/uL (ref 0.0–0.1)
Basophils Relative: 0 %
Eosinophils Absolute: 0 10*3/uL (ref 0.0–0.5)
Eosinophils Relative: 0 %
HCT: 46.6 % — ABNORMAL HIGH (ref 36.0–46.0)
Hemoglobin: 14.9 g/dL (ref 12.0–15.0)
Immature Granulocytes: 0 %
Lymphocytes Relative: 11 %
Lymphs Abs: 1.4 10*3/uL (ref 0.7–4.0)
MCH: 29.4 pg (ref 26.0–34.0)
MCHC: 32 g/dL (ref 30.0–36.0)
MCV: 92.1 fL (ref 80.0–100.0)
Monocytes Absolute: 0.4 10*3/uL (ref 0.1–1.0)
Monocytes Relative: 4 %
Neutro Abs: 10.6 10*3/uL — ABNORMAL HIGH (ref 1.7–7.7)
Neutrophils Relative %: 85 %
Platelets: 279 10*3/uL (ref 150–400)
RBC: 5.06 MIL/uL (ref 3.87–5.11)
RDW: 13.1 % (ref 11.5–15.5)
WBC: 12.5 10*3/uL — ABNORMAL HIGH (ref 4.0–10.5)
nRBC: 0 % (ref 0.0–0.2)

## 2021-11-14 LAB — COMPREHENSIVE METABOLIC PANEL
ALT: 30 U/L (ref 0–44)
AST: 34 U/L (ref 15–41)
Albumin: 4.5 g/dL (ref 3.5–5.0)
Alkaline Phosphatase: 61 U/L (ref 38–126)
Anion gap: 8 (ref 5–15)
BUN: 13 mg/dL (ref 8–23)
CO2: 24 mmol/L (ref 22–32)
Calcium: 9 mg/dL (ref 8.9–10.3)
Chloride: 105 mmol/L (ref 98–111)
Creatinine, Ser: 0.87 mg/dL (ref 0.44–1.00)
GFR, Estimated: >60 mL/min (ref >60)
Glucose, Bld: 143 mg/dL — ABNORMAL HIGH (ref 70–99)
Potassium: 4.1 mmol/L (ref 3.5–5.1)
Sodium: 137 mmol/L (ref 135–145)
Total Bilirubin: 0.9 mg/dL (ref 0.3–1.2)
Total Protein: 7.5 g/dL (ref 6.5–8.1)

## 2021-11-14 MED ORDER — DOCUSATE SODIUM 100 MG PO CAPS: 100.0000 mg | ORAL_CAPSULE | Freq: Two times a day (BID) | ORAL | 2 refills | Status: DC

## 2021-11-14 MED ORDER — MORPHINE SULFATE (PF) 2 MG/ML IV SOLN
2.0000 mg | Freq: Once | INTRAVENOUS | Status: AC
  Administered 2021-11-14: 2 mg via INTRAVENOUS
  Filled 2021-11-14: qty 1

## 2021-11-14 MED ORDER — MELOXICAM 7.5 MG PO TABS: 7.5000 mg | ORAL_TABLET | Freq: Every day | ORAL | 0 refills | Status: DC

## 2021-11-14 MED ORDER — KETOROLAC TROMETHAMINE 30 MG/ML IJ SOLN
15.0000 mg | Freq: Once | INTRAMUSCULAR | Status: AC
  Administered 2021-11-14: 15 mg via INTRAVENOUS
  Filled 2021-11-14: qty 1

## 2021-11-14 MED ORDER — MORPHINE SULFATE (PF) 4 MG/ML IV SOLN
4.0000 mg | Freq: Once | INTRAVENOUS | Status: AC
  Administered 2021-11-14: 4 mg via INTRAVENOUS
  Filled 2021-11-14: qty 1

## 2021-11-14 MED ORDER — OXYCODONE HCL 5 MG PO TABS: 5.0000 mg | ORAL_TABLET | Freq: Four times a day (QID) | ORAL | 0 refills | Status: DC | PRN

## 2021-11-14 MED ORDER — ONDANSETRON 4 MG PO TBDP: 4.0000 mg | ORAL_TABLET | Freq: Four times a day (QID) | ORAL | 0 refills | Status: DC | PRN

## 2021-11-14 MED ORDER — ONDANSETRON HCL 4 MG/2ML IJ SOLN
4.0000 mg | Freq: Once | INTRAMUSCULAR | Status: AC
  Administered 2021-11-14: 4 mg via INTRAVENOUS
  Filled 2021-11-14: qty 2

## 2021-11-14 NOTE — ED Triage Notes (Signed)
Pt presents via EMS with left knee pain. She notes having this knee replaced previously and is scheduled to have the replacment changed on 12/02/2021. Pt was doing some yard work today and following that her knee has become edematous and tender to touch. Pt has had a baker's cyst rupture in the posterior knee and it was aspirated 2 weeks ago in office with her surgeon. No fevers or chills.

## 2021-11-14 NOTE — Discharge Instructions (Addendum)

## 2021-11-14 NOTE — ED Triage Notes (Signed)
First nurse note-To ED via [facility] from home. C/o left knee pain. Per EMS, edema noted, pt non weight bearing on affected extremity due to pain. Denies injury. States shes been doing yard work all day which made pain worse.

## 2021-11-14 NOTE — ED Provider Notes (Signed)
[facility] Provider Note    Event Date/Time   First MD Initiated Contact with Patient 11/14/21 0301     (approximate)   History   Knee Pain   HPI  Jasmine Sullivan is a 76 y.o. female with history of COPD, hypertension, previous left knee replacement by Dr. Rosita Kea 2 years ago who presents to the emergency department with complaints of left knee pain and swelling.  States that she has had intermittent joint effusions before.  She denies any injury but states she did do a lot of yard work today and [employer] and states when she came back inside after being outside she started feeling that her knee was tight and uncomfortable.  Continued to swell overnight and she could not find a position of comfort.  States she took ibuprofen and gabapentin prior to arrival without any relief.  She is being followed by Dr. Odis Luster who is actually planning to do a revision of this knee replacement on August 8.  She states she had a joint effusion a few weeks ago that he drained in the office.  She states he was reluctant to drain it because of risk of infection.  She states it provided her some pain relief for about a week before it started swelling again.  She denies any fevers or chills.  No redness or warmth.  No calf tenderness or calf swelling.  No numbness.  Unable to ambulate due to pain.  Normally ambulates without any assistive devices.   History provided by patient and EMS.    Past Medical History:  Diagnosis Date   Arthritis    Asthma    COPD (chronic obstructive pulmonary disease) (HCC)    Dyspnea    DOE   Fatty liver    Glaucoma    Heart murmur    History of methicillin resistant staphylococcus aureus (MRSA) 2000   Hypertension     Past Surgical History:  Procedure Laterality Date   CATARACT EXTRACTION W/PHACO Right 03/23/2017   Procedure: CATARACT EXTRACTION PHACO AND INTRAOCULAR LENS PLACEMENT (IOC);  Surgeon: Galen Manila, MD;  Location: [facility] ORS;   Service: Ophthalmology;  Laterality: Right;  [place] 00:36.7 AP% 13.2 CDE 4.84 Fluid Pack lot # [id number]   CHOLECYSTECTOMY     DILATION AND CURETTAGE OF UTERUS     SHOULER Left    rotator cuff   THORACOTOMY     WEDGE RESECTION   TOTAL KNEE ARTHROPLASTY Left 05/09/2019   Procedure: LEFT TOTAL KNEE ARTHROPLASTY;  Surgeon: Kennedy Bucker, MD;  Location: [facility] ORS;  Service: Orthopedics;  Laterality: Left;    MEDICATIONS:  Prior to Admission medications   Medication Sig Start Date End Date Taking? Authorizing Provider  albuterol (PROVENTIL HFA;VENTOLIN HFA) 108 (90 Base) MCG/ACT inhaler Inhale 1 puff every 6 (six) hours as needed into the lungs for wheezing or shortness of breath.    [provider]  aspirin EC 81 MG tablet Take 81 mg daily by mouth.    [provider]  Calcium Carb-Cholecalciferol (CALCIUM 600+D) 600-800 MG-UNIT TABS Take 1 tablet by mouth daily at 12 noon.     [provider]  cetirizine (ZYRTEC) 10 MG tablet Take 10 mg at bedtime by mouth.    [provider]  clotrimazole-betamethasone (LOTRISONE) cream Apply 1 application topically 2 (two) times daily as needed (eczema).    [provider]  furosemide (LASIX) 20 MG tablet Take 20 mg by mouth daily.     [provider]  gabapentin (NEURONTIN) 100 MG capsule  09/05/19   [provider]  montelukast (SINGULAIR) 10 MG tablet Take 10 mg daily by mouth.    [provider]  Omega-3 Fatty Acids (FISH OIL) 1000 MG CAPS Take 3,000 mg by mouth daily at 12 noon.     [provider]  Propylene Glycol (SYSTANE BALANCE) 0.6 % SOLN Place 1 drop into both eyes 3 (three) times daily as needed (dry/irritated eyes.).    [provider]  raloxifene (EVISTA) 60 MG tablet Take 60 mg by mouth daily at 12 noon.     [provider]  simvastatin (ZOCOR) 10 MG tablet Take 10 mg at bedtime by mouth.    [provider]  triamcinolone cream (KENALOG) 0.1  % Apply 1 application topically 2 (two) times daily as needed (eczema).    [provider]  valsartan (DIOVAN) 80 MG tablet Take 80 mg by mouth daily.    [provider]  Vibegron 75 MG TABS Take 75 mg by mouth daily. 05/23/21   Marguerita Beards, MD    Physical Exam   Triage Vital Signs: ED Triage Vitals  Enc Vitals Group     BP 11/14/21 0239 (!) 189/71     Pulse Rate 11/14/21 0239 68     Resp 11/14/21 0239 18     Temp 11/14/21 0239 98 F (36.7 C)     Temp src --      SpO2 11/14/21 0239 100 %     Weight 11/14/21 0235 155 lb (70.3 kg)     Height 11/14/21 0235 5\' 5"  (1.651 m)     Head Circumference --      Peak Flow --      Pain Score 11/14/21 0243 6     Pain Loc --      Pain Edu? --      Excl. in GC? --     Most recent vital signs: Vitals:   11/14/21 0446 11/14/21 0615  BP: (!) 152/75 (!) 161/72  Pulse: 70 67  Resp: 16 16  Temp:  98.1 F (36.7 C)  SpO2: 96% 96%    CONSTITUTIONAL: Alert and oriented and responds appropriately to questions. Well-appearing; well-nourished HEAD: Normocephalic, atraumatic EYES: Conjunctivae clear, pupils appear equal, sclera nonicteric ENT: normal nose; moist mucous membranes NECK: Supple, normal ROM CARD: RRR; S1 and S2 appreciated; no murmurs, no clicks, no rubs, no gallops RESP: Normal chest excursion without splinting or tachypnea; breath sounds clear and equal bilaterally; no wheezes, no rhonchi, no rales, no hypoxia or respiratory distress, speaking full sentences ABD/GI: Normal bowel sounds; non-distended; soft, non-tender, no rebound, no guarding, no peritoneal signs BACK: The back appears normal EXT: Patient has a large left knee effusion.  She keeps the knee partially flexed and has a hard time fully extending it due to pain.  No calf tenderness or calf swelling.  Left 2+ DP pulse.  Compartments are soft.  There is no redness, warmth over the left knee.  She is tender to palpation diffusely over the left knee  especially through the medial and lateral joint line SKIN: Normal color for age and race; warm; no rash on exposed skin NEURO: Moves all extremities equally, normal speech PSYCH: The patient's mood and manner are appropriate.   ED Results / Procedures / Treatments   LABS: (all labs ordered are listed, but only abnormal results are displayed) Labs Reviewed  CBC WITH DIFFERENTIAL/PLATELET - Abnormal; Notable for the following components:  Result Value   WBC 12.5 (*)    HCT 46.6 (*)    Neutro Abs 10.6 (*)    All other components within normal limits  COMPREHENSIVE METABOLIC PANEL - Abnormal; Notable for the following components:   Glucose, Bld 143 (*)    All other components within normal limits     EKG:   RADIOLOGY: My personal review and interpretation of imaging: X-ray shows no fracture, dislocation but does show a large left knee effusion.  I have personally reviewed all radiology reports.   DG Knee Complete 4 Views Left  Result Date: 11/14/2021 CLINICAL DATA:  Left knee pain and edema. Nonweightbearing due to pain. EXAM: LEFT KNEE - COMPLETE 4+ VIEW COMPARISON:  05/09/2019 FINDINGS: Postoperative left total knee arthroplasty including patellar femoral component. Arthroplasty components appear well seated. No evidence of acute fracture or dislocation. No focal bone lesion or bone destruction. There is a large left knee effusion. IMPRESSION: Left total knee arthroplasty. Components appear well seated. Large left knee effusion. No acute fractures identified. Electronically Signed   By: Burman Nieves M.D.   On: 11/14/2021 03:14     PROCEDURES:  Critical Care performed: No     Procedures    IMPRESSION / MDM / ASSESSMENT AND PLAN / ED COURSE  I reviewed the triage vital signs and the nursing notes.    Patient here with large left knee effusion and complaints of knee pain.     DIFFERENTIAL DIAGNOSIS (includes but not limited to):   Knee effusion, meniscal  injury, sprain, strain, less likely fracture or dislocation, low suspicion for gout or septic arthritis   Patient's presentation is most consistent with acute presentation with potential threat to life or bodily function.   PLAN: We will obtain x-ray of the left knee.  CBC, CMP obtained from triage as well.  We will give IV pain medication, elevate her extremity and apply ice.  We did discuss risk and benefits of arthrocentesis.  Patient states she would like to hold off on arthrocentesis at this time given risk of infection.  I think this is reasonable.  My suspicion for a septic joint is extremely low.   MEDICATIONS GIVEN IN ED: Medications  morphine (PF) 4 MG/ML injection 4 mg (4 mg Intravenous Given 11/14/21 0342)  ondansetron (ZOFRAN) injection 4 mg (4 mg Intravenous Given 11/14/21 0342)  ketorolac (TORADOL) 30 MG/ML injection 15 mg (15 mg Intravenous Given 11/14/21 0342)  morphine (PF) 2 MG/ML injection 2 mg (2 mg Intravenous Given 11/14/21 0445)     ED COURSE: Patient reports feeling better after pain medication and has been able to get up and ambulate with a walker.  Her labs show slight leukocytosis of 12,000 but we discussed this could be reactive and does not necessarily imply a septic joint.  There still is no redness or warmth to this area.  Normal platelet count.  She is not on antiplatelets or anticoagulants that would be causing a large hemarthrosis.  Have again offered her arthrocentesis multiple times for pain management but she understands that again there are risk for infection which could be especially bad given she has a joint replacement but also is not treating the source of her inflammatory process so that this fluid could quickly reaccumulate.  She again would like to hold off on arthrocentesis and states she will follow-up with Dr. Odis Luster as an outpatient.  We have placed her in an Ace wrap.  She states she has knee sleeves at home that  she can use for compression.   Recommended rest, elevation and ice.  Will discharge with short course of analgesia including oxycodone and Mobic.  She has normal renal function.  Patient verbalized understanding and is comfortable with this plan.  She states she has a walker at home to use.  She has been able to ambulate here using a walker without other assistance.   At this time, I do not feel there is any life-threatening condition present. I reviewed all nursing notes, vitals, pertinent previous records.  All lab and urine results, EKGs, imaging ordered have been independently reviewed and interpreted by myself.  I reviewed all available radiology reports from any imaging ordered this visit.  Based on my assessment, I feel the patient is safe to be discharged home without further emergent workup and can continue workup as an outpatient as needed. Discussed all findings, treatment plan as well as usual and customary return precautions.  They verbalize understanding and are comfortable with this plan.  Outpatient follow-up has been provided as needed.  All questions have been answered.   CONSULTS: No emergent orthopedic consult needed at this time.  Patient here with large effusion likely hemarthrosis.  Low suspicion for septic arthritis.  Patient declines arthrocentesis at this time.   OUTSIDE RECORDS REVIEWED: Reviewed patient's last office visit with Dr. Rosita Kea on 09/06/2019 and recent documentation with Dr. Odis Luster on 10/29/2021.       FINAL CLINICAL IMPRESSION(S) / ED DIAGNOSES   Final diagnoses:  Effusion of left knee     Rx / DC Orders   ED Discharge Orders          Ordered    oxyCODONE (ROXICODONE) 5 MG immediate release tablet  Every 6 hours PRN        11/14/21 0546    ondansetron (ZOFRAN-ODT) 4 MG disintegrating tablet  Every 6 hours PRN        11/14/21 0546    meloxicam (MOBIC) 7.5 MG tablet  Daily        11/14/21 0546    docusate sodium (COLACE) 100 MG capsule  2 times daily        11/14/21 0546              Note:  This document was prepared using Dragon voice recognition software and may include unintentional dictation errors.   Sasan Wilkie, Layla Maw, DO 11/14/21 [phone]

## 2021-11-14 NOTE — ED Notes (Signed)
Pt medicated for pain, ace wrap applied to the left knee, pt provided with a walker, ambulate without assistance.

## 2021-11-20 ENCOUNTER — Other Ambulatory Visit: Payer: Self-pay

## 2021-11-20 ENCOUNTER — Encounter: Payer: Self-pay | Admitting: Orthopedic Surgery

## 2021-11-20 ENCOUNTER — Encounter
Admission: RE | Admit: 2021-11-20 | Discharge: 2021-11-20 | Disposition: A | Discharge status: Home / Self Care | Payer: Medicare PPO | Source: Ambulatory Visit | Attending: Orthopedic Surgery | Admitting: Orthopedic Surgery

## 2021-11-20 ENCOUNTER — Other Ambulatory Visit: Payer: Self-pay | Admitting: Orthopedic Surgery

## 2021-11-20 VITALS — BP 143/64 | Resp 16 | Ht 65.0 in | Wt 163.1 lb

## 2021-11-20 DIAGNOSIS — Z01818 Encounter for other preprocedural examination: Secondary | ICD-10-CM | POA: Insufficient documentation

## 2021-11-20 DIAGNOSIS — Z01812 Encounter for preprocedural laboratory examination: Secondary | ICD-10-CM

## 2021-11-20 HISTORY — DX: Other complications of anesthesia, initial encounter: T88.59XA

## 2021-11-20 LAB — URINALYSIS, ROUTINE W REFLEX MICROSCOPIC
Bacteria, UA: NONE SEEN
Bilirubin Urine: NEGATIVE
Glucose, UA: 50 mg/dL — AB
Ketones, ur: NEGATIVE mg/dL
Leukocytes,Ua: NEGATIVE
Nitrite: NEGATIVE
Protein, ur: NEGATIVE mg/dL
Specific Gravity, Urine: 1.006 (ref 1.005–1.030)
Squamous Epithelial / HPF: NONE SEEN (ref 0–5)
WBC, UA: NONE SEEN WBC/hpf (ref 0–5)
pH: 5 (ref 5.0–8.0)

## 2021-11-20 LAB — SURGICAL PCR SCREEN
MRSA, PCR: NEGATIVE
Staphylococcus aureus: NEGATIVE

## 2021-11-20 LAB — TYPE AND SCREEN
ABO/RH(D): O POS
Antibody Screen: NEGATIVE

## 2021-11-20 NOTE — H&P (Signed)
Jasmine Sullivan MRN:  [id number] DOB/SEX:  11-03-1945/female  CHIEF COMPLAINT:  Painful left Knee  HISTORY: Patient is a 76 y.o. female presented with a history of pain in the left knee. Onset of symptoms was gradual starting several years ago with gradually worsening course since that time. Prior procedures on the knee include arthroplasty. Patient has been treated conservatively with over-the-counter NSAIDs and activity modification. Patient currently rates pain in the knee at 10 out of 10 with activity. There is no pain at night.  PAST MEDICAL HISTORY: Patient Active Problem List   Diagnosis Date Noted   Lymphedema 09/28/2019   Chronic venous insufficiency 09/28/2019   DJD (degenerative joint disease) of knee 09/28/2019   Hyperlipidemia 09/28/2019   Past Medical History:  Diagnosis Date   Arthritis    Asthma    Complication of anesthesia    Benadryl [Diphenhydramine] recieved after surgery and was difficult to wake up   COPD (chronic obstructive pulmonary disease) (HCC)    Dyspnea    DOE   Fatty liver    Glaucoma    Heart murmur    History of methicillin resistant staphylococcus aureus (MRSA) 2000   Hypertension    Past Surgical History:  Procedure Laterality Date   CATARACT EXTRACTION W/PHACO Right 03/23/2017   Procedure: CATARACT EXTRACTION PHACO AND INTRAOCULAR LENS PLACEMENT (IOC);  Surgeon: Galen Manila, MD;  Location: [facility] ORS;  Service: Ophthalmology;  Laterality: Right;  [place] 00:36.7 AP% 13.2 CDE 4.84 Fluid Pack lot # [id number]   CHOLECYSTECTOMY     DILATION AND CURETTAGE OF UTERUS     SHOULER Left    rotator cuff   THORACOTOMY     WEDGE RESECTION   TOTAL KNEE ARTHROPLASTY Left 05/09/2019   Procedure: LEFT TOTAL KNEE ARTHROPLASTY;  Surgeon: Kennedy Bucker, MD;  Location: [facility] ORS;  Service: Orthopedics;  Laterality: Left;     MEDICATIONS:  (Not in a hospital admission)   ALLERGIES:   Allergies  Allergen Reactions   Paroxetine Hcl Itching   Simbrinza  [Brinzolamide-Brimonidine] Other (See Comments)    Dry mouth, itching eyes, "eczema eyes" - tolerates with [place] eye drops   Benadryl [Diphenhydramine]     Glaucoma (caused optic nerve damage)   Crab [Shellfish Allergy] Swelling    And itching   Amlodipine Rash   Benzonatate Rash   Chlorhexidine Rash   Erythromycin Other (See Comments)    Stomach issues    REVIEW OF SYSTEMS:  Pertinent items are noted in HPI.   FAMILY HISTORY:   Family History  Problem Relation Age of Onset   Breast cancer Mother 22    SOCIAL HISTORY:   Social History   Tobacco Use   Smoking status: Former   Smokeless tobacco: Never   Tobacco comments:    45 years ago  Substance Use Topics   Alcohol use: Yes    Alcohol/week: 14.0 standard drinks of alcohol    Types: 14 Cans of beer per week     EXAMINATION:  Vital signs in last 24 hours: @VSRANGES @  Head: Normocephalic, without obvious abnormality, atraumatic Neck: no JVD and supple, symmetrical, trachea midline Lungs: clear to auscultation bilaterally Heart: regular rate and rhythm, S1, S2 normal, no murmur, click, rub or gallop Abdomen: soft, non-tender; bowel sounds normal; no masses,  no organomegaly Extremities: extremities normal, atraumatic, no cyanosis or edema and Homans sign is negative, no sign of DVT Pulses: 2+ and symmetric Skin: Skin color, texture, turgor normal. No rashes or lesions Neurologic: Alert and oriented  X 3, normal strength and tone. Normal symmetric reflexes. Normal coordination and gait  Musculoskeletal:  ROM 0-90, Ligaments intact,  Imaging Review Plain radiographs demonstrate previous components of a total knee  Assessment/Plan: Left painful knee after arthroplasty  Left total knee revision    [name] 11/20/2021, 10:03 AM

## 2021-11-20 NOTE — Patient Instructions (Addendum)
Your procedure is scheduled on: 12/02/21 - Tuesday Report to the Registration Desk on the 1st floor of the [facility]. To find out your arrival time, please call [phone] between 1PM - 3PM on: 12/01/21 - Monday If your arrival time is 6:00 am, do not arrive prior to that time as the [facility] entrance doors do not open until 6:00 am.  REMEMBER: Instructions that are not followed completely may result in serious medical risk, up to and including death; or upon the discretion of your surgeon and anesthesiologist your surgery may need to be rescheduled.  Do not eat food after midnight the night before surgery.  No gum chewing, lozengers or hard candies.  You may however, drink CLEAR liquids up to 2 hours before you are scheduled to arrive for your surgery. Do not drink anything within 2 hours of your scheduled arrival time.  Clear liquids include: - water  - apple juice without pulp - gatorade (not RED colors) - black coffee or tea (Do NOT add milk or creamers to the coffee or tea) Do NOT drink anything that is not on this list.  TAKE THESE MEDICATIONS THE MORNING OF SURGERY WITH A SIP OF WATER:  - Vibegron  - raloxifene (EVISTA) - latanoprost (XALATAN)  - montelukast (SINGULAIR)  Use inhaler albuterol (PROVENTIL) on the day of surgery and bring to the hospital.  Follow recommendations from Cardiologist, Pulmonologist or PCP regarding stopping Aspirin, Coumadin, Plavix, Eliquis, Pradaxa, or Pletal.  One week prior to surgery: meloxicam (MOBIC) 11/24/21 - stop taking these medications. Stop Anti-inflammatories (NSAIDS) such as Advil, Aleve, Ibuprofen, Motrin, Naproxen, Naprosyn and Aspirin based products such as Excedrin, Goodys Powder, BC Powder.  Stop ANY OVER THE COUNTER supplements until after surgery. Omega-3 Fatty Acids ,CALCIUM 600+D.  You may take Tylenol if needed for pain up until the day of surgery.  No Alcohol for 24 hours before or after surgery.  No  Smoking including e-cigarettes for 24 hours prior to surgery.  No chewable tobacco products for at least 6 hours prior to surgery.  No nicotine patches on the day of surgery.  Do not use any "recreational" drugs for at least a week prior to your surgery.  Please be advised that the combination of cocaine and anesthesia may have negative outcomes, up to and including death. If you test positive for cocaine, your surgery will be cancelled.  On the morning of surgery brush your teeth with toothpaste and water, you may rinse your mouth with mouthwash if you wish. Do not swallow any toothpaste or mouthwash.  Do not wear jewelry, make-up, hairpins, clips or nail polish.  Do not wear lotions, powders, or perfumes.   Do not shave body from the neck down 48 hours prior to surgery just in case you cut yourself which could leave a site for infection.  Also, freshly shaved skin may become irritated if using the CHG soap.  Contact lenses, hearing aids and dentures may not be worn into surgery.  Do not bring valuables to the hospital. [facility] is not responsible for any missing/lost belongings or valuables.   Notify your doctor if there is any change in your medical condition (cold, fever, infection).  Wear comfortable clothing (specific to your surgery type) to the hospital.  After surgery, you can help prevent lung complications by doing breathing exercises.  Take deep breaths and cough every 1-2 hours. Your doctor may order a device called an Incentive Spirometer to help you take deep breaths. When  coughing or sneezing, hold a pillow firmly against your incision with both hands. This is called "splinting." Doing this helps protect your incision. It also decreases belly discomfort.  If you are being admitted to the hospital overnight, leave your suitcase in the car. After surgery it may be brought to your room.  If you are being discharged the day of surgery, you will not be allowed to drive  home. You will need a responsible adult (18 years or older) to drive you home and stay with you that night.   If you are taking public transportation, you will need to have a responsible adult (18 years or older) with you. Please confirm with your physician that it is acceptable to use public transportation.   Please call the Pre-admissions Testing Dept. at [phone] if you have any questions about these instructions.  Surgery Visitation Policy:  Patients undergoing a surgery or procedure may have two family members or support persons with them as long as the person is not COVID-19 positive or experiencing its symptoms.   Inpatient Visitation:    Visiting hours are 7 a.m. to 8 p.m. Up to four visitors are allowed at one time in a patient room, including children. The visitors may rotate out with other people during the day. One designated support person (adult) may remain overnight.

## 2021-12-02 ENCOUNTER — Other Ambulatory Visit: Payer: Self-pay

## 2021-12-02 ENCOUNTER — Encounter: Payer: Self-pay | Admitting: Orthopedic Surgery

## 2021-12-02 ENCOUNTER — Encounter
Admission: RE | Disposition: A | Discharge status: Home / Self Care | Payer: Self-pay | Source: Ambulatory Visit | Attending: Orthopedic Surgery

## 2021-12-02 ENCOUNTER — Inpatient Hospital Stay: Payer: Medicare PPO

## 2021-12-02 ENCOUNTER — Inpatient Hospital Stay: Payer: Medicare PPO | Admitting: General Practice

## 2021-12-02 ENCOUNTER — Inpatient Hospital Stay: Payer: Medicare PPO | Admitting: Urgent Care

## 2021-12-02 ENCOUNTER — Inpatient Hospital Stay
Admission: RE | Admit: 2021-12-02 | Discharge: 2021-12-04 | DRG: 468 | Disposition: A | Discharge status: Home / Self Care | Payer: Medicare PPO | Source: Ambulatory Visit | Attending: Orthopedic Surgery | Admitting: Orthopedic Surgery

## 2021-12-02 DIAGNOSIS — T84093A Other mechanical complication of internal left knee prosthesis, initial encounter: Secondary | ICD-10-CM | POA: Diagnosis not present

## 2021-12-02 DIAGNOSIS — Z9889 Other specified postprocedural states: Secondary | ICD-10-CM | POA: Diagnosis not present

## 2021-12-02 DIAGNOSIS — T8484XA Pain due to internal orthopedic prosthetic devices, implants and grafts, initial encounter: Secondary | ICD-10-CM | POA: Diagnosis not present

## 2021-12-02 DIAGNOSIS — T84033A Mechanical loosening of internal left knee prosthetic joint, initial encounter: Principal | ICD-10-CM | POA: Diagnosis present

## 2021-12-02 DIAGNOSIS — I10 Essential (primary) hypertension: Secondary | ICD-10-CM | POA: Diagnosis present

## 2021-12-02 DIAGNOSIS — Z96652 Presence of left artificial knee joint: Secondary | ICD-10-CM | POA: Diagnosis present

## 2021-12-02 DIAGNOSIS — M25462 Effusion, left knee: Secondary | ICD-10-CM | POA: Diagnosis not present

## 2021-12-02 DIAGNOSIS — Z96642 Presence of left artificial hip joint: Secondary | ICD-10-CM | POA: Diagnosis not present

## 2021-12-02 DIAGNOSIS — R600 Localized edema: Secondary | ICD-10-CM | POA: Diagnosis not present

## 2021-12-02 DIAGNOSIS — Z471 Aftercare following joint replacement surgery: Secondary | ICD-10-CM | POA: Diagnosis not present

## 2021-12-02 DIAGNOSIS — J449 Chronic obstructive pulmonary disease, unspecified: Secondary | ICD-10-CM | POA: Diagnosis present

## 2021-12-02 DIAGNOSIS — K760 Fatty (change of) liver, not elsewhere classified: Secondary | ICD-10-CM | POA: Diagnosis present

## 2021-12-02 HISTORY — PX: TOTAL KNEE REVISION: SHX996

## 2021-12-02 SURGERY — TOTAL KNEE REVISION
Anesthesia: Spinal | Site: Knee | Laterality: Left

## 2021-12-02 MED ORDER — LIDOCAINE HCL (CARDIAC) PF 100 MG/5ML IV SOSY
PREFILLED_SYRINGE | INTRAVENOUS | Status: DC | PRN
  Administered 2021-12-02: 40 mg via INTRAVENOUS

## 2021-12-02 MED ORDER — CEFAZOLIN SODIUM-DEXTROSE 2-4 GM/100ML-% IV SOLN
2.0000 g | INTRAVENOUS | Status: AC
  Administered 2021-12-02: 2 g via INTRAVENOUS

## 2021-12-02 MED ORDER — METOCLOPRAMIDE HCL 5 MG/ML IJ SOLN: 5.0000 mg | Freq: Three times a day (TID) | INTRAMUSCULAR | Status: DC | PRN

## 2021-12-02 MED ORDER — OXYCODONE HCL 5 MG/5ML PO SOLN: 5.0000 mg | Freq: Once | ORAL | Status: DC | PRN

## 2021-12-02 MED ORDER — DEXAMETHASONE SODIUM PHOSPHATE 10 MG/ML IJ SOLN
INTRAMUSCULAR | Status: DC | PRN
  Administered 2021-12-02: 10 mg via INTRAVENOUS

## 2021-12-02 MED ORDER — ONDANSETRON 4 MG PO TBDP: 4.0000 mg | ORAL_TABLET | Freq: Four times a day (QID) | ORAL | Status: DC | PRN

## 2021-12-02 MED ORDER — PROPOFOL 1000 MG/100ML IV EMUL
INTRAVENOUS | Status: AC
  Filled 2021-12-02: qty 100

## 2021-12-02 MED ORDER — IRBESARTAN 150 MG PO TABS
75.0000 mg | ORAL_TABLET | Freq: Every day | ORAL | Status: DC
  Administered 2021-12-03 – 2021-12-04 (×2): 75 mg via ORAL
  Filled 2021-12-02 (×2): qty 1

## 2021-12-02 MED ORDER — PHENYLEPHRINE HCL-NACL 20-0.9 MG/250ML-% IV SOLN
INTRAVENOUS | Status: DC | PRN
  Administered 2021-12-02: 40 ug/min via INTRAVENOUS

## 2021-12-02 MED ORDER — BUPIVACAINE HCL (PF) 0.5 % IJ SOLN
INTRAMUSCULAR | Status: AC
  Filled 2021-12-02: qty 10

## 2021-12-02 MED ORDER — MAGNESIUM CITRATE PO SOLN: 1.0000 | Freq: Once | ORAL | Status: DC | PRN

## 2021-12-02 MED ORDER — BUPIVACAINE LIPOSOME 1.3 % IJ SUSP
INTRAMUSCULAR | Status: AC
  Filled 2021-12-02: qty 20

## 2021-12-02 MED ORDER — LACTATED RINGERS IV SOLN: INTRAVENOUS | Status: DC

## 2021-12-02 MED ORDER — ACETAMINOPHEN 325 MG PO TABS: 325.0000 mg | ORAL_TABLET | Freq: Four times a day (QID) | ORAL | Status: DC | PRN

## 2021-12-02 MED ORDER — MENTHOL 3 MG MT LOZG: 1.0000 | LOZENGE | OROMUCOSAL | Status: DC | PRN

## 2021-12-02 MED ORDER — HYDROCODONE-ACETAMINOPHEN 7.5-325 MG PO TABS
1.0000 | ORAL_TABLET | ORAL | Status: DC | PRN
  Administered 2021-12-03 (×2): 2 via ORAL
  Filled 2021-12-02 (×2): qty 2

## 2021-12-02 MED ORDER — BISACODYL 10 MG RE SUPP: 10.0000 mg | Freq: Every day | RECTAL | Status: DC | PRN

## 2021-12-02 MED ORDER — DEXAMETHASONE SODIUM PHOSPHATE 10 MG/ML IJ SOLN
INTRAMUSCULAR | Status: AC
  Filled 2021-12-02: qty 1

## 2021-12-02 MED ORDER — CHLORHEXIDINE GLUCONATE 0.12 % MT SOLN
OROMUCOSAL | Status: AC
  Filled 2021-12-02: qty 15

## 2021-12-02 MED ORDER — FAMOTIDINE 20 MG PO TABS
ORAL_TABLET | ORAL | Status: AC
  Administered 2021-12-02: 20 mg via ORAL
  Filled 2021-12-02: qty 1

## 2021-12-02 MED ORDER — LORATADINE 10 MG PO TABS
10.0000 mg | ORAL_TABLET | Freq: Every day | ORAL | Status: DC
  Filled 2021-12-02: qty 1

## 2021-12-02 MED ORDER — MIRABEGRON ER 25 MG PO TB24
25.0000 mg | ORAL_TABLET | Freq: Every day | ORAL | Status: DC
  Administered 2021-12-03 – 2021-12-04 (×2): 25 mg via ORAL
  Filled 2021-12-02 (×2): qty 1

## 2021-12-02 MED ORDER — ALBUTEROL SULFATE (2.5 MG/3ML) 0.083% IN NEBU: 3.0000 mL | INHALATION_SOLUTION | Freq: Four times a day (QID) | RESPIRATORY_TRACT | Status: DC | PRN

## 2021-12-02 MED ORDER — 0.9 % SODIUM CHLORIDE (POUR BTL) OPTIME
TOPICAL | Status: DC | PRN
  Administered 2021-12-02: 1000 mL

## 2021-12-02 MED ORDER — ONDANSETRON HCL 4 MG/2ML IJ SOLN
INTRAMUSCULAR | Status: AC
  Filled 2021-12-02: qty 2

## 2021-12-02 MED ORDER — BUPIVACAINE HCL (PF) 0.5 % IJ SOLN
INTRAMUSCULAR | Status: DC | PRN
  Administered 2021-12-02: 2.6 mL

## 2021-12-02 MED ORDER — ONDANSETRON HCL 4 MG/2ML IJ SOLN: 4.0000 mg | Freq: Four times a day (QID) | INTRAMUSCULAR | Status: DC | PRN

## 2021-12-02 MED ORDER — ALUM & MAG HYDROXIDE-SIMETH 200-200-20 MG/5ML PO SUSP: 30.0000 mL | ORAL | Status: DC | PRN

## 2021-12-02 MED ORDER — FUROSEMIDE 20 MG PO TABS
20.0000 mg | ORAL_TABLET | Freq: Every day | ORAL | Status: DC
  Administered 2021-12-03 – 2021-12-04 (×2): 20 mg via ORAL
  Filled 2021-12-02 (×2): qty 1

## 2021-12-02 MED ORDER — ONDANSETRON HCL 4 MG/2ML IJ SOLN
INTRAMUSCULAR | Status: DC | PRN
  Administered 2021-12-02: 4 mg via INTRAVENOUS

## 2021-12-02 MED ORDER — RALOXIFENE HCL 60 MG PO TABS
60.0000 mg | ORAL_TABLET | Freq: Every day | ORAL | Status: DC
  Administered 2021-12-03 – 2021-12-04 (×2): 60 mg via ORAL
  Filled 2021-12-02 (×2): qty 1

## 2021-12-02 MED ORDER — MORPHINE SULFATE (PF) 2 MG/ML IV SOLN
0.5000 mg | INTRAVENOUS | Status: DC | PRN
  Administered 2021-12-02: 1 mg via INTRAVENOUS
  Filled 2021-12-02: qty 1

## 2021-12-02 MED ORDER — POLYVINYL ALCOHOL 1.4 % OP SOLN: 1.0000 [drp] | Freq: Three times a day (TID) | OPHTHALMIC | Status: DC | PRN

## 2021-12-02 MED ORDER — MIDAZOLAM HCL 2 MG/2ML IJ SOLN
INTRAMUSCULAR | Status: AC
  Filled 2021-12-02: qty 2

## 2021-12-02 MED ORDER — BUPIVACAINE-EPINEPHRINE (PF) 0.5% -1:200000 IJ SOLN
INTRAMUSCULAR | Status: AC
  Filled 2021-12-02: qty 30

## 2021-12-02 MED ORDER — DOCUSATE SODIUM 100 MG PO CAPS: 100.0000 mg | ORAL_CAPSULE | Freq: Two times a day (BID) | ORAL | Status: DC

## 2021-12-02 MED ORDER — DOCUSATE SODIUM 100 MG PO CAPS
100.0000 mg | ORAL_CAPSULE | Freq: Two times a day (BID) | ORAL | Status: DC
  Administered 2021-12-02 – 2021-12-04 (×4): 100 mg via ORAL
  Filled 2021-12-02 (×4): qty 1

## 2021-12-02 MED ORDER — ONDANSETRON HCL 4 MG PO TABS: 4.0000 mg | ORAL_TABLET | Freq: Four times a day (QID) | ORAL | Status: DC | PRN

## 2021-12-02 MED ORDER — CEFAZOLIN SODIUM-DEXTROSE 2-4 GM/100ML-% IV SOLN
INTRAVENOUS | Status: AC
  Filled 2021-12-02: qty 100

## 2021-12-02 MED ORDER — PHENOL 1.4 % MT LIQD: 1.0000 | OROMUCOSAL | Status: DC | PRN

## 2021-12-02 MED ORDER — TRANEXAMIC ACID-NACL 1000-0.7 MG/100ML-% IV SOLN
INTRAVENOUS | Status: AC
  Filled 2021-12-02: qty 100

## 2021-12-02 MED ORDER — HYDROCODONE-ACETAMINOPHEN 5-325 MG PO TABS
1.0000 | ORAL_TABLET | ORAL | Status: DC | PRN
  Administered 2021-12-02 – 2021-12-04 (×4): 1 via ORAL
  Filled 2021-12-02 (×4): qty 1

## 2021-12-02 MED ORDER — KETOROLAC TROMETHAMINE 15 MG/ML IJ SOLN
INTRAMUSCULAR | Status: AC
  Filled 2021-12-02: qty 1

## 2021-12-02 MED ORDER — ASPIRIN 81 MG PO CHEW
81.0000 mg | CHEWABLE_TABLET | Freq: Two times a day (BID) | ORAL | Status: DC
  Administered 2021-12-02 – 2021-12-04 (×4): 81 mg via ORAL
  Filled 2021-12-02 (×4): qty 1

## 2021-12-02 MED ORDER — MONTELUKAST SODIUM 10 MG PO TABS
10.0000 mg | ORAL_TABLET | Freq: Every day | ORAL | Status: DC
  Administered 2021-12-03 – 2021-12-04 (×2): 10 mg via ORAL
  Filled 2021-12-02 (×2): qty 1

## 2021-12-02 MED ORDER — METOCLOPRAMIDE HCL 5 MG PO TABS: 5.0000 mg | ORAL_TABLET | Freq: Three times a day (TID) | ORAL | Status: DC | PRN

## 2021-12-02 MED ORDER — KETOROLAC TROMETHAMINE 15 MG/ML IJ SOLN
7.5000 mg | Freq: Four times a day (QID) | INTRAMUSCULAR | Status: AC
  Administered 2021-12-02 – 2021-12-03 (×4): 7.5 mg via INTRAVENOUS
  Filled 2021-12-02 (×3): qty 1

## 2021-12-02 MED ORDER — LATANOPROST 0.005 % OP SOLN
1.0000 [drp] | Freq: Every morning | OPHTHALMIC | Status: DC
  Administered 2021-12-03 – 2021-12-04 (×2): 1 [drp] via OPHTHALMIC
  Filled 2021-12-02: qty 2.5

## 2021-12-02 MED ORDER — MIDAZOLAM HCL 5 MG/5ML IJ SOLN
INTRAMUSCULAR | Status: DC | PRN
  Administered 2021-12-02: 2 mg via INTRAVENOUS

## 2021-12-02 MED ORDER — PROPOFOL 500 MG/50ML IV EMUL
INTRAVENOUS | Status: DC | PRN
  Administered 2021-12-02: 100 ug/kg/min via INTRAVENOUS

## 2021-12-02 MED ORDER — TRANEXAMIC ACID-NACL 1000-0.7 MG/100ML-% IV SOLN
1000.0000 mg | INTRAVENOUS | Status: AC
  Administered 2021-12-02: 1000 mg via INTRAVENOUS

## 2021-12-02 MED ORDER — FENTANYL CITRATE (PF) 100 MCG/2ML IJ SOLN: 25.0000 ug | INTRAMUSCULAR | Status: DC | PRN

## 2021-12-02 MED ORDER — CEFAZOLIN SODIUM-DEXTROSE 1-4 GM/50ML-% IV SOLN
1.0000 g | Freq: Four times a day (QID) | INTRAVENOUS | Status: AC
  Administered 2021-12-02 – 2021-12-03 (×2): 1 g via INTRAVENOUS
  Filled 2021-12-02 (×2): qty 50

## 2021-12-02 MED ORDER — POVIDONE-IODINE 10 % EX SWAB: 2.0000 | Freq: Once | CUTANEOUS | Status: DC

## 2021-12-02 MED ORDER — SIMVASTATIN 20 MG PO TABS
10.0000 mg | ORAL_TABLET | Freq: Every day | ORAL | Status: DC
Start: 2021-12-02 — End: 2021-12-04
  Administered 2021-12-02 – 2021-12-03 (×2): 10 mg via ORAL
  Filled 2021-12-02 (×2): qty 1

## 2021-12-02 MED ORDER — FAMOTIDINE 20 MG PO TABS: 20.0000 mg | ORAL_TABLET | Freq: Once | ORAL | Status: AC

## 2021-12-02 MED ORDER — SURGIRINSE WOUND IRRIGATION SYSTEM - OPTIME
TOPICAL | Status: DC | PRN
  Administered 2021-12-02: 900 mL

## 2021-12-02 MED ORDER — SODIUM CHLORIDE (PF) 0.9 % IJ SOLN
INTRAMUSCULAR | Status: DC | PRN
  Administered 2021-12-02: 90 mL via INTRAMUSCULAR

## 2021-12-02 MED ORDER — SODIUM CHLORIDE FLUSH 0.9 % IV SOLN
INTRAVENOUS | Status: AC
  Filled 2021-12-02: qty 40

## 2021-12-02 MED ORDER — LIDOCAINE HCL (PF) 2 % IJ SOLN
INTRAMUSCULAR | Status: AC
  Filled 2021-12-02: qty 5

## 2021-12-02 MED ORDER — SODIUM CHLORIDE 0.9 % IR SOLN
Status: DC | PRN
  Administered 2021-12-02: 3000 mL

## 2021-12-02 MED ORDER — OXYCODONE HCL 5 MG PO TABS: 5.0000 mg | ORAL_TABLET | Freq: Once | ORAL | Status: DC | PRN

## 2021-12-02 SURGICAL SUPPLY — 73 items
ARTICULAR GENESIS CONST IN (Insert) ×2 IMPLANT
ARTISURF GENESIS (Insert) IMPLANT
BASEPLATE TIBIAL SZ4 LEFT KNEE (Plate) ×1 IMPLANT
BLADE SAGITTAL AGGR TOOTH XLG (BLADE) IMPLANT
BLADE SAW SAG 25X90X1.19 (BLADE) ×1 IMPLANT
BRUSH SCRUB EZ  4% CHG (MISCELLANEOUS) ×2
BRUSH SCRUB EZ 4% CHG (MISCELLANEOUS) ×2 IMPLANT
CEMENT GMV SMARTSET GENT 40G (Cement) ×2 IMPLANT
CHLORAPREP W/TINT 26 (MISCELLANEOUS) ×4 IMPLANT
COMP FEMORAL CONST SZ5 LT KNEE (Knees) ×2 IMPLANT
COMP PATELLA GENESIS 35MM (Stem) ×2 IMPLANT
COMPONENT FEMRL CONST SZ5LT KN (Knees) IMPLANT
COMPONENT PATELLA GENESIS 35MM (Stem) IMPLANT
CONE TIB ID LEGION SHRT 22 (Orthopedic Implant) ×1 IMPLANT
COOLER POLAR GLACIER W/PUMP (MISCELLANEOUS) ×2 IMPLANT
CUFF TOURN SGL QUICK 24 (TOURNIQUET CUFF)
CUFF TOURN SGL QUICK 34 (TOURNIQUET CUFF)
CUFF TRNQT CYL 24X4X16.5-23 (TOURNIQUET CUFF) IMPLANT
CUFF TRNQT CYL 34X4.125X (TOURNIQUET CUFF) IMPLANT
DRAPE 3/4 80X56 (DRAPES) ×2 IMPLANT
DRAPE INCISE IOBAN 66X60 STRL (DRAPES) ×2 IMPLANT
DRSG AQUACEL AG ADV 3.5X14 (GAUZE/BANDAGES/DRESSINGS) ×2 IMPLANT
GAUZE SPONGE 4X4 12PLY STRL (GAUZE/BANDAGES/DRESSINGS) ×1 IMPLANT
GAUZE XEROFORM 1X8 LF (GAUZE/BANDAGES/DRESSINGS) ×2 IMPLANT
GLOVE BIOGEL PI IND STRL 8.5 (GLOVE) ×1 IMPLANT
GLOVE BIOGEL PI INDICATOR 8.5 (GLOVE) ×1
GLOVE SURG ORTHO 8.5 STRL (GLOVE) ×4 IMPLANT
GOWN STRL REUS W/ TWL LRG LVL3 (GOWN DISPOSABLE) ×2 IMPLANT
GOWN STRL REUS W/ TWL XL LVL3 (GOWN DISPOSABLE) ×1 IMPLANT
GOWN STRL REUS W/TWL LRG LVL3 (GOWN DISPOSABLE) ×2
GOWN STRL REUS W/TWL XL LVL3 (GOWN DISPOSABLE) ×1
HOLDER FOLEY CATH W/STRAP (MISCELLANEOUS) ×2 IMPLANT
HOOD PEEL AWAY FLYTE STAYCOOL (MISCELLANEOUS) ×4 IMPLANT
IV NS 1000ML (IV SOLUTION) ×1
IV NS 1000ML BAXH (IV SOLUTION) ×1 IMPLANT
KIT TURNOVER KIT A (KITS) ×2 IMPLANT
LABEL OR SOLS (LABEL) ×2 IMPLANT
LEGION DISTAL SIZE 5 5MM KNEE (Orthopedic Implant) ×2 IMPLANT
MANIFOLD NEPTUNE II (INSTRUMENTS) ×2 IMPLANT
MAT ABSORB  FLUID 56X50 GRAY (MISCELLANEOUS) ×1
MAT ABSORB FLUID 56X50 GRAY (MISCELLANEOUS) ×1 IMPLANT
NDL SAFETY ECLIPSE 18X1.5 (NEEDLE) ×1 IMPLANT
NDL SPNL 20GX3.5 QUINCKE YW (NEEDLE) ×1 IMPLANT
NEEDLE HYPO 18GX1.5 SHARP (NEEDLE) ×1
NEEDLE SPNL 20GX3.5 QUINCKE YW (NEEDLE) ×2 IMPLANT
NS IRRIG 1000ML POUR BTL (IV SOLUTION) ×2 IMPLANT
OSTEOTOME THIN 10.0 1.5 (INSTRUMENTS) ×2 IMPLANT
PACK TOTAL KNEE (MISCELLANEOUS) ×2 IMPLANT
PAD ABD DERMACEA PRESS 5X9 (GAUZE/BANDAGES/DRESSINGS) ×1 IMPLANT
PAD CAST CTTN 4X4 STRL (SOFTGOODS) IMPLANT
PAD DE MAYO PRESSURE PROTECT (MISCELLANEOUS) ×2 IMPLANT
PAD WRAPON POLAR KNEE (MISCELLANEOUS) ×2 IMPLANT
PADDING CAST COTTON 4X4 STRL (SOFTGOODS) ×1
PIN TROCAR 3 INCH (PIN) ×1 IMPLANT
PULSAVAC PLUS IRRIG FAN TIP (DISPOSABLE) ×2
SOLUTION PRONTOSAN WOUND 350ML (IRRIGATION / IRRIGATOR) ×2 IMPLANT
STAPLER SKIN PROX 35W (STAPLE) ×2 IMPLANT
STEM PRESSFIT STRT 14X160MM (Stem) ×1 IMPLANT
STEM PRESSFIT TRT 18X160 (Stem) ×1 IMPLANT
STOCKINETTE BIAS CUT 6 980064 (GAUZE/BANDAGES/DRESSINGS) ×1 IMPLANT
SUCTION FRAZIER HANDLE 10FR (MISCELLANEOUS) ×1
SUCTION TUBE FRAZIER 10FR DISP (MISCELLANEOUS) ×1 IMPLANT
SUT DVC 2 QUILL PDO  T11 36X36 (SUTURE) ×1
SUT DVC 2 QUILL PDO T11 36X36 (SUTURE) ×1 IMPLANT
SUT VIC AB 2-0 CT1 18 (SUTURE) ×2 IMPLANT
SUT VIC AB 2-0 CT1 27 (SUTURE) ×1
SUT VIC AB 2-0 CT1 TAPERPNT 27 (SUTURE) ×1 IMPLANT
SUT VIC AB PLUS 45CM 1-MO-4 (SUTURE) ×2 IMPLANT
SYR 30ML LL (SYRINGE) ×4 IMPLANT
TIP FAN IRRIG PULSAVAC PLUS (DISPOSABLE) ×1 IMPLANT
TRAY FOLEY MTR SLVR 16FR STAT (SET/KITS/TRAYS/PACK) ×2 IMPLANT
WATER STERILE IRR 500ML POUR (IV SOLUTION) ×2 IMPLANT
WRAPON POLAR PAD KNEE (MISCELLANEOUS) ×4

## 2021-12-02 NOTE — Transfer of Care (Signed)
Immediate Anesthesia Transfer of Care Note  Patient: Jasmine Sullivan  Procedure(s) Performed: TOTAL KNEE REVISION (Left: Knee)  Patient Location: PACU  Anesthesia Type:Spinal  Level of Consciousness: awake, alert  and oriented  Airway & Oxygen Therapy: Patient Spontanous Breathing and Patient connected to face mask oxygen  Post-op Assessment: Report given to RN and Post -op Vital signs reviewed and stable  Post vital signs: Reviewed and stable  Last Vitals:  Vitals Value Taken Time  BP 111/90 12/02/21 1800  Temp    Pulse 73 12/02/21 1802  Resp 19 12/02/21 1802  SpO2 98 % 12/02/21 1802  Vitals shown include unvalidated device data.  Last Pain:  Vitals:   12/02/21 1230  TempSrc: Temporal  PainSc: 3          Complications: No notable events documented.

## 2021-12-02 NOTE — Progress Notes (Signed)
Patients bladder scanned for 784 cc, Dr Odis Luster aware

## 2021-12-02 NOTE — Plan of Care (Signed)

## 2021-12-02 NOTE — Anesthesia Procedure Notes (Signed)
Spinal  Patient location during procedure: OR Start time: 12/02/2021 2:46 PM End time: 12/02/2021 2:54 PM Reason for block: surgical anesthesia Staffing Performed: resident/CRNA  Resident/CRNA: Cammie Sickle, CRNA Performed by: Cammie Sickle, CRNA Authorized by: Dimas Millin, MD   Preanesthetic Checklist Completed: patient identified, IV checked, site marked, risks and benefits discussed, surgical consent, monitors and equipment checked, pre-op evaluation and timeout performed Spinal Block Patient position: sitting Prep: Betadine Patient monitoring: heart rate, continuous pulse ox and blood pressure Approach: midline Location: L4-5 Injection technique: single-shot Needle Needle type: Introducer and Pencan  Needle gauge: 24 G Needle length: 10 cm Assessment Sensory level: T6 Events: CSF return Additional Notes Negative paresthesia. Negative blood return. Positive free-flowing CSF. Expiration date of kit checked and confirmed. Patient tolerated procedure well, without complications.

## 2021-12-02 NOTE — H&P (Signed)
The patient has been re-examined, and the chart reviewed, and there have been no interval changes to the documented history and physical.  Plan a left total knee revision today.  Anesthesia is consulted regarding a peripheral nerve block for post-operative pain.  The risks, benefits, and alternatives have been discussed at length, and the patient is willing to proceed.     

## 2021-12-02 NOTE — Anesthesia Preprocedure Evaluation (Signed)
Anesthesia Evaluation  Patient identified by MRN, date of birth, ID band Patient awake    Reviewed: Allergy & Precautions, NPO status , Patient's Chart, lab work & pertinent test results  History of Anesthesia Complications (+) PROLONGED EMERGENCE and history of anesthetic complications  Airway Mallampati: II  TM Distance: >3 FB Neck ROM: full    Dental  (+) Missing   Pulmonary neg shortness of breath, asthma , neg sleep apnea, neg COPD, former smoker,    Pulmonary exam normal        Cardiovascular hypertension, (-) angina(-) CAD, (-) Past MI and (-) CABG negative cardio ROS Normal cardiovascular exam     Neuro/Psych negative neurological ROS  negative psych ROS   GI/Hepatic negative GI ROS, Neg liver ROS,   Endo/Other  negative endocrine ROS  Renal/GU      Musculoskeletal   Abdominal   Peds  Hematology negative hematology ROS (+)   Anesthesia Other Findings Past Medical History: No date: Arthritis No date: Asthma No date: Complication of anesthesia     Comment:  Benadryl (Diphenhydramine) recieved after surgery and               was difficult to wake up No date: COPD (chronic obstructive pulmonary disease) (HCC) No date: Dyspnea     Comment:  DOE No date: Fatty liver No date: Glaucoma No date: Heart murmur 2000: History of methicillin resistant staphylococcus aureus (MRSA) No date: Hypertension  Past Surgical History: 03/23/2017: CATARACT EXTRACTION W/PHACO; Right     Comment:  Procedure: CATARACT EXTRACTION PHACO AND INTRAOCULAR               LENS PLACEMENT (IOC);  Surgeon: Galen Manila, MD;                Location: [facility] ORS;  Service: Ophthalmology;  Laterality:              Right;  [place] 00:36.7 AP% 13.2 CDE 4.84 Fluid Pack lot #               [id number] No date: DILATION AND CURETTAGE OF UTERUS No date: SHOULER; Left     Comment:  rotator cuff No date: THORACOTOMY     Comment:  WEDGE  RESECTION 05/09/2019: TOTAL KNEE ARTHROPLASTY; Left     Comment:  Procedure: LEFT TOTAL KNEE ARTHROPLASTY;  Surgeon: Kennedy Bucker, MD;  Location: [facility] ORS;  Service: Orthopedics;               Laterality: Left;  BMI    Body Mass Index: 27.15 kg/m      Reproductive/Obstetrics negative OB ROS                             Anesthesia Physical Anesthesia Plan  ASA: 2  Anesthesia Plan: Spinal   Post-op Pain Management:    Induction:   PONV Risk Score and Plan:   Airway Management Planned: Natural Airway and Nasal Cannula  Additional Equipment:   Intra-op Plan:   Post-operative Plan:   Informed Consent: I have reviewed the patients History and Physical, chart, labs and discussed the procedure including the risks, benefits and alternatives for the proposed anesthesia with the patient or authorized representative who has indicated his/her understanding and acceptance.     Dental Advisory Given  Plan Discussed with: Anesthesiologist, CRNA and Surgeon  Anesthesia Plan Comments: (Patient reports no  bleeding problems and no anticoagulant use.  Plan for spinal with backup GA  Patient consented for risks of anesthesia including but not limited to:  - adverse reactions to medications - damage to eyes, teeth, lips or other oral mucosa - nerve damage due to positioning  - risk of bleeding, infection and or nerve damage from spinal that could lead to paralysis - risk of headache or failed spinal - damage to teeth, lips or other oral mucosa - sore throat or hoarseness - damage to heart, brain, nerves, lungs, other parts of body or loss of life  Patient voiced understanding.)        Anesthesia Quick Evaluation

## 2021-12-02 NOTE — Op Note (Signed)
DATE OF SURGERY:  12/02/2021 TIME: 5:53 PM  PATIENT NAME:  Jasmine Sullivan   AGE: 76 y.o.    PRE-OPERATIVE DIAGNOSIS:  M25.462 Effusion, left knee T84.84XD Pain due to internal orthopedic prosth dev/grft, subs  POST-OPERATIVE DIAGNOSIS:  Same  PROCEDURE:  Procedure(s): TOTAL KNEE REVISION, LEFT, ALL THREE COMPONENTS  SURGEON:  Lyndle Herrlich, MD   ASSISTANT:  [name],  PA-C  OPERATIVE IMPLANTS: Katrinka [employer]azing [employer]hew, Legion Oxinium constrained Femoral component size 5, two 5 mm distal femoral augments, Legion revision Tray size 4 with 22 I.D. Legion tibial cone and 160 mm by 14 mm press-fit stem, Patella polyethylene 3-peg oval button size 35 mm, with a 11 mm constrained insert.   PREOPERATIVE INDICATIONS:  Jasmine Sullivan is an 76 y.o. female who has a diagnosis of M25.462 Effusion, left knee T84.84XD Pain due to internal orthopedic prosth dev/grft, subs and elected for a total knee revision arthroplasty after failing nonoperative treatment, including activity modification, pain medication, physical therapy and injections who has significant impairment of their activities of daily living.  Radiographs and bone scan have demonstrated aseptic loosening of all three components.  The risks, benefits, and alternatives were discussed at length including but not limited to the risks of infection, bleeding, nerve or blood vessel injury, knee stiffness, fracture, dislocation, loosening or failure of the hardware and the need for further surgery. Medical risks include but not limited to DVT and pulmonary embolism, myocardial infarction, stroke, pneumonia, respiratory failure and death. I discussed these risks with the patient in my office prior to the date of surgery. They understood these risks and were willing to proceed.  OPERATIVE FINDINGS AND UNIQUE ASPECTS OF THE CASE:  Large amounts of scar within the medial and lateral gutters, abundance of synovial fluid and lack of fixation between the  cement and component interface.   OPERATIVE DESCRIPTION:  The patient was brought to the operative room and placed in a supine position after undergoing placement of a general anesthetic. IV antibiotics were given. Patient received tranexamic acid. The lower extremity was prepped and draped in the usual sterile fashion.  A time out was performed to verify the patient's name, date of birth, medical record number, correct site of surgery and correct procedure to be performed. The timeout was also used to confirm the patient received antibiotics and that appropriate instruments, implants and radiographs studies were available in the room.  The leg was elevated and exsanguinated with an Esmarch and the tourniquet was inflated to 250 mmHg.  A midline incision was made over the left knee. The prior scar was excised. A medial parapatellar arthrotomy was then made and the patella subluxed laterally and the knee was brought into 90 of flexion. Scar tissue was removed from the medial and lateral gutters.  Using flexible osteotomes, rongeurs and bone tamps, all three components were removed and passed from the field.  Attention was then turned to preparation of the patella. The thickness of the patella was measured with a caliper, the diameter measured with the patella templates.  The patella resection was then made with an oscillating saw using the patella cutting guide.  The 35 mm button fit appropriately.  3 peg holes for the patella component were then drilled.  The tibia was reamed and broached for the tibial cone. A cut was made perpendicular to the mechanical axis. The trial tibial component was then placed.  The distal femur was reamed and 5 mm of additional bone was cut distally. The  trial component was then placed.  Trial components were inserted with polyethylene trials. The knee achieved full extension and flexed to 120 degrees. Ligament were stable to varus and valgus at full extension as well as  30, 60 and 90 degrees of flexion.   All trial components were then removed.  The joint was copiously irrigated with pulse lavage.  The final total knee arthroplasty components were then cemented into place. The knee was held in extension while cement was allowed to cure.The knee was taken through a range of motion and the patella tracked well and the knee was again irrigated copiously.  The knee capsule was then injected with Exparel.  The medial arthrotomy was closed with #1 Vicryl and #2 Quill. The subcutaneous tissue closed with  2-0 vicryl, and skin approximated with staples.  A dry sterile and compressive dressing was applied.  A Polar Care was applied to the operative knee.  The patient was awakened and brought to the PACU in stable and satisfactory condition.  All sharp, lap and instrument counts were correct at the conclusion the case. I spoke with the patient's family in the postop consultation room to let them know the case had been performed without complication and the patient was stable in recovery room.   Total tourniquet time was 95 minutes.

## 2021-12-03 ENCOUNTER — Encounter: Payer: Self-pay | Admitting: Orthopedic Surgery

## 2021-12-03 MED ORDER — CETIRIZINE HCL 10 MG PO TABS
10.0000 mg | ORAL_TABLET | Freq: Every day | ORAL | Status: DC
  Filled 2021-12-03: qty 1

## 2021-12-03 MED ORDER — CETIRIZINE HCL 10 MG PO TABS
10.0000 mg | ORAL_TABLET | Freq: Every day | ORAL | Status: DC
  Administered 2021-12-03: 10 mg via ORAL
  Filled 2021-12-03: qty 1

## 2021-12-03 NOTE — Progress Notes (Signed)
Physical Therapy Treatment Patient Details Name: Jasmine Sullivan MRN: [id number] DOB: 12-Feb-1946 Today's Date: 12/03/2021   History of Present Illness 76 y/o female s/p L total knee revision (TKA ~2.5 years ago)    PT Comments    Pt continues to do very well POD1 with ability to easily do SLRs, bend >90, independent mobility and a consistent and confident cadence for 2 loops around the nurses' station.  Pt still needing some reinforcement and encouragement but ultimately doing quite well.  Minimal pain, stable vitals and no overt LOBs or safety issues t/o the session.    Recommendations for follow up therapy are one component of a multi-disciplinary discharge planning process, led by the attending physician.  Recommendations may be updated based on patient status, additional functional criteria and insurance authorization.  Follow Up Recommendations  Follow physician's recommendations for discharge plan and follow up therapies     Assistance Recommended at Discharge Intermittent Supervision/Assistance  Patient can return home with the following Assist for transportation;Assistance with cooking/housework;A little help with bathing/dressing/bathroom   Equipment Recommendations  Rolling walker (2 wheels)    Recommendations for Other Services       Precautions / Restrictions Precautions Precautions: Knee;Fall Restrictions LLE Weight Bearing: Weight bearing as tolerated     Mobility  Bed Mobility Overal bed mobility: Modified Independent             General bed mobility comments: in recliner on arrival    Transfers Overall transfer level: Modified independent Equipment used: Rolling walker (2 wheels)               General transfer comment: Pt was able to rise to standing w/o assist, good overall confidence and minimal need for cuing.    Ambulation/Gait Ambulation/Gait assistance: Supervision Gait Distance (Feet): 350 Feet Assistive device: Rolling walker (2  wheels)         General Gait Details: Again quickly able to assume consistent cadence, had good L knee ROM and weight acceptance, minimal UE reliance or fatigue, no LOBs   Stairs Stairs: Yes Stairs assistance: Supervision Stair Management: One rail Right, Backwards, Forwards, Step to pattern, With walker Number of Stairs: 9 General stair comments: Performed multiple strategies for steps, pt able to do all w/o phyiscal assist.  1) single step negotiation with walker 2) 4 steps with single rail 3) 4 steps (retro) with walker - VCs and reinforcement t/o each but pt able to safely and confidently get up/down them well   Wheelchair Mobility    Modified Rankin (Stroke Patients Only)       Balance Overall balance assessment: Modified Independent                                          Cognition Arousal/Alertness: Awake/alert Behavior During Therapy: WFL for tasks assessed/performed Overall Cognitive Status: Within Functional Limits for tasks assessed                                          Exercises Total Joint Exercises Ankle Circles/Pumps: AROM, 10 reps Quad Sets: Strengthening, 10 reps Short Arc Quad: Strengthening, 10 reps Heel Slides: Strengthening, 10 reps (with resisted leg ext) Hip ABduction/ADduction: Strengthening, 10 reps Straight Leg Raises: AROM, 10 reps Long Arc Quad: Strengthening, 10 reps Knee Flexion: PROM, 5  reps Goniometric ROM: 0-93    General Comments        Pertinent Vitals/Pain Pain Assessment Pain Assessment: 0-10 Pain Score: 3  Pain Location: minimal rest pain, very tolerable with activity    Home Living                          Prior Function            PT Goals (current goals can now be found in the care plan section) Progress towards PT goals: Progressing toward goals    Frequency    BID      PT Plan Current plan remains appropriate    Co-evaluation               AM-PAC PT "6 Clicks" Mobility   Outcome Measure  Help needed turning from your back to your side while in a flat bed without using bedrails?: None Help needed moving from lying on your back to sitting on the side of a flat bed without using bedrails?: None Help needed moving to and from a bed to a chair (including a wheelchair)?: None Help needed standing up from a chair using your arms (e.g., wheelchair or bedside chair)?: None Help needed to walk in hospital room?: A Little Help needed climbing 3-5 steps with a railing? : A Little 6 Click Score: 22    End of Session Equipment Utilized During Treatment: Gait belt Activity Tolerance: Patient tolerated treatment well Patient left: in chair;with call bell/phone within reach Nurse Communication: Mobility status PT Visit Diagnosis: Muscle weakness (generalized) (M62.81);Difficulty in walking, not elsewhere classified (R26.2);Pain Pain - Right/Left: Left Pain - part of body: Knee     Time: 2505-3976 PT Time Calculation (min) (ACUTE ONLY): 28 min  Charges:  $Gait Training: 8-22 mins $Therapeutic Exercise: 8-22 mins                     Malachi Pro, DPT 12/03/2021, 3:02 PM

## 2021-12-03 NOTE — Progress Notes (Signed)
Spoke with the patient She lives alone but has a friend Bjorn Loser that will be providing transportation and helping her as needed She has a rolling walker and a 3 in 1 at home and would like to have another 3 in 1, I explained ins will not cover one yet since she has only had that one a little over 2 years, she will get her friend to go to the [facility]osp[facility] outlet to get one for her, she is set up with outpatient Pt next Tuesday and does not feel that she will need [facility] services No additional needs at this time

## 2021-12-03 NOTE — Progress Notes (Signed)
  Subjective:  Patient reports pain as mild to moderate.    Objective:   VITALS:   Vitals:   12/02/21 1854 12/02/21 1938 12/03/21 0610 12/03/21 0750  BP: 116/61 128/71 (!) 113/55 122/63  Pulse: 67 66 71 70  Resp: 16 14 18 16   Temp: 98.1 F (36.7 C) 97.7 F (36.5 C) 98.3 F (36.8 C) 97.7 F (36.5 C)  TempSrc:    Oral  SpO2: 95% 100% 96% 98%  Weight:      Height:        PHYSICAL EXAM:  Neurologically intact ABD soft Neurovascular intact Sensation intact distally Intact pulses distally Dorsiflexion/Plantar flexion intact Incision: moderate drainage and drs No cellulitis present Compartment soft  LABS  No results found for this or any previous visit (from the past 24 hour(s)).  DG Knee Left Port  Result Date: 12/02/2021 CLINICAL DATA:  Status post revision of total knee replacement. EXAM: PORTABLE LEFT KNEE - 1-2 VIEW COMPARISON:  Radiograph 11/14/2021 FINDINGS: Revision left knee arthroplasty in expected alignment. No periprosthetic lucency or fracture. Recent postsurgical change includes air and edema in the soft tissues and joint space. Anterior skin staples in place. IMPRESSION: Revision left knee arthroplasty without immediate postoperative complication. Electronically Signed   By: 11/16/2021 M.D.   On: 12/02/2021 18:27    Assessment/Plan: 1 Day Post-Op   Principal Problem:   Status post revision of total knee replacement, left   Advance diet Up with therapy D/C home tomorrow   02/01/2022 , PA-C 12/03/2021, 8:01 AM

## 2021-12-03 NOTE — Anesthesia Postprocedure Evaluation (Signed)
Anesthesia Post Note  Patient: Jasmine Sullivan  Procedure(s) Performed: TOTAL KNEE REVISION (Left: Knee)  Patient location during evaluation: Nursing Unit Anesthesia Type: Spinal Level of consciousness: oriented and awake and alert Pain management: pain level controlled Vital Signs Assessment: post-procedure vital signs reviewed and stable Respiratory status: spontaneous breathing and respiratory function stable Cardiovascular status: blood pressure returned to baseline and stable Postop Assessment: no headache, no backache, no apparent nausea or vomiting, patient able to bend at knees and able to ambulate Anesthetic complications: no   No notable events documented.   Last Vitals:  Vitals:   12/03/21 0610 12/03/21 0750  BP: (!) 113/55 122/63  Pulse: 71 70  Resp: 18 16  Temp: 36.8 C 36.5 C  SpO2: 96% 98%    Last Pain:  Vitals:   12/03/21 0750  TempSrc: Oral  PainSc:                  Rosanne Gutting

## 2021-12-03 NOTE — Plan of Care (Signed)
  Problem: Activity: Goal: Risk for activity intolerance will decrease Outcome: Progressing   Problem: Nutrition: Goal: Adequate nutrition will be maintained Outcome: Progressing   Problem: Elimination: Goal: Will not experience complications related to urinary retention Outcome: Progressing   Problem: Pain Managment: Goal: General experience of comfort will improve Outcome: Progressing   Problem: Safety: Goal: Ability to remain free from injury will improve Outcome: Progressing   

## 2021-12-03 NOTE — Evaluation (Signed)
Physical Therapy Evaluation Patient Details Name: Jasmine Sullivan MRN: [id number] DOB: [birth year] Today's Date: 12/03/2021  History of Present Illness  76 y/o female s/p L total knee revision (TKA ~2.5 years ago)  Clinical Impression  Pt did well with PT exam POD1, she was able to circumambulate the nurses' station with relative ease and showed consistent cadence and appropriate WBing tolerance/AD use.  She had > 90 deg flexion, showed good quad control, negotiated steps w/o issue and overall achieved appropriate POD1 expectations.  Pt doing well, reports she has a friend that will be helping at home and taking her to appointments.       Recommendations for follow up therapy are one component of a multi-disciplinary discharge planning process, led by the attending physician.  Recommendations may be updated based on patient status, additional functional criteria and insurance authorization.  Follow Up Recommendations Follow physician's recommendations for discharge plan and follow up therapies      Assistance Recommended at Discharge Intermittent Supervision/Assistance  Patient can return home with the following  Assist for transportation;Assistance with cooking/housework;A little help with bathing/dressing/bathroom    Equipment Recommendations Rolling walker (2 wheels)  Recommendations for Other Services       Functional Status Assessment Patient has had a recent decline in their functional status and demonstrates the ability to make significant improvements in function in a reasonable and predictable amount of time.     Precautions / Restrictions Precautions Precautions: Knee;Fall Precaution Booklet Issued: Yes (comment) (HEP) Restrictions Weight Bearing Restrictions: Yes LLE Weight Bearing: Weight bearing as tolerated      Mobility  Bed Mobility               General bed mobility comments: in recliner on arrival, reports she does not need assist     Transfers Overall transfer level: Modified independent Equipment used: Rolling walker (2 wheels)               General transfer comment: Pt was able to rise to standing w/o assist, good overall confidence and minimal need for cuing.    Ambulation/Gait Ambulation/Gait assistance: Supervision Gait Distance (Feet): 250 Feet Assistive device: Rolling walker (2 wheels)         General Gait Details: Pt was able to assume consistent cadence, had good L knee ROM and weight acceptance, minimal UE reliance and fatigue, no LOBs  Stairs Stairs: Yes Stairs assistance: Supervision Stair Management: One rail Right, Backwards, Forwards, Step to pattern, With walker Number of Stairs: 9 General stair comments: Performed multiple strategies for steps, pt able to do all w/o phyiscal assist.  1) single step negotiation with walker 2) 4 steps with single rail 3) 4 steps (retro) with walker - VCs and reinforcement t/o each but pt able to safely and confidently get up/down them well  Wheelchair Mobility    Modified Rankin (Stroke Patients Only)       Balance Overall balance assessment: Modified Independent                                           Pertinent Vitals/Pain Pain Assessment Pain Assessment: 0-10 Pain Score: 5  Pain Location: reports essentially no pain at rest    Home Living Family/patient expects to be discharged to:: Private residence Living Arrangements: Alone Available Help at Discharge: Friend(s) (can stay over night the first few days)   Home Access: Stairs to  enter Entrance Stairs-Rails: Right (grab bar) Entrance Stairs-Number of Steps: 1   Home Layout: Able to live on main level with bedroom/bathroom Home Equipment: Rolling Walker (2 wheels);Cane - single point;BSC/3in1;Grab bars - tub/shower      Prior Function Prior Level of Function :  (Pt has been liming around recently due to L knee issues)                     Hand  Dominance        Extremity/Trunk Assessment   Upper Extremity Assessment Upper Extremity Assessment: Generalized weakness    Lower Extremity Assessment Lower Extremity Assessment: LLE deficits/detail;Generalized weakness (expected post-op weakness, able to do SLRs, functional t/o)       Communication   Communication: No difficulties  Cognition Arousal/Alertness: Awake/alert Behavior During Therapy: WFL for tasks assessed/performed Overall Cognitive Status: Within Functional Limits for tasks assessed                                          General Comments General comments (skin integrity, edema, etc.): Pt was able to ambulate around the nurses' station, negotiate steps, participate well with exercises and reoprts, repeatedly, that she is doing much better post-op this time vs initial TKA ~3 years ago    Exercises Total Joint Exercises Ankle Circles/Pumps: AROM, 10 reps Quad Sets: Strengthening, 10 reps Heel Slides: Strengthening, 10 reps (with resisted leg ext) Hip ABduction/ADduction: Strengthening, 10 reps Straight Leg Raises: AROM, 10 reps Knee Flexion: PROM, 5 reps Goniometric ROM: 0-93   Assessment/Plan    PT Assessment Patient needs continued PT services  PT Problem List Decreased strength;Decreased range of motion;Decreased activity tolerance;Pain;Decreased knowledge of use of DME;Decreased balance;Decreased mobility;Decreased safety awareness       PT Treatment Interventions DME instruction;Gait training;Stair training;Functional mobility training;Therapeutic activities;Therapeutic exercise;Balance training;Neuromuscular re-education;Patient/family education    PT Goals (Current goals can be found in the Care Plan section)  Acute Rehab PT Goals Patient Stated Goal: go home PT Goal Formulation: With patient Time For Goal Achievement: 12/16/21 Potential to Achieve Goals: Good    Frequency BID     Co-evaluation                AM-PAC PT "6 Clicks" Mobility  Outcome Measure Help needed turning from your back to your side while in a flat bed without using bedrails?: None Help needed moving from lying on your back to sitting on the side of a flat bed without using bedrails?: None Help needed moving to and from a bed to a chair (including a wheelchair)?: None Help needed standing up from a chair using your arms (e.g., wheelchair or bedside chair)?: A Little Help needed to walk in hospital room?: A Little Help needed climbing 3-5 steps with a railing? : A Little 6 Click Score: 21    End of Session Equipment Utilized During Treatment: Gait belt Activity Tolerance: Patient tolerated treatment well Patient left: with chair alarm set;with call bell/phone within reach Nurse Communication: Mobility status PT Visit Diagnosis: Muscle weakness (generalized) (M62.81);Difficulty in walking, not elsewhere classified (R26.2);Pain Pain - Right/Left: Left Pain - part of body: Knee    Time: 3614-4315 PT Time Calculation (min) (ACUTE ONLY): 49 min   Charges:   PT Evaluation $PT Eval Low Complexity: 1 Low PT Treatments $Gait Training: 8-22 mins $Therapeutic Exercise: 8-22 mins  Malachi Pro, DPT 12/03/2021, 11:20 AM

## 2021-12-04 MED ORDER — OXYCODONE HCL 5 MG PO TABS: 5.0000 mg | ORAL_TABLET | ORAL | 0 refills | Status: DC | PRN

## 2021-12-04 MED ORDER — ASPIRIN 81 MG PO CHEW: 81.0000 mg | CHEWABLE_TABLET | Freq: Two times a day (BID) | ORAL | 0 refills | Status: AC

## 2021-12-04 MED ORDER — METHOCARBAMOL 750 MG PO TABS: 750.0000 mg | ORAL_TABLET | Freq: Three times a day (TID) | ORAL | 0 refills | Status: DC | PRN

## 2021-12-04 NOTE — Plan of Care (Signed)
Patient discharged per MD orders at this time.All discharge instructions,education and medications reviewed with the patient.Pt expressed understanding and will comply with dc instructions.follow up appointments was also communicated to the patient.no verbal c/o or any ssx of distress.Patient was discharged home with outpatient PT services per order.Pt was transported home by her friend in a privately owned vehicle.

## 2021-12-04 NOTE — Progress Notes (Signed)
  Subjective:  Patient reports pain as mild.    Objective:   VITALS:   Vitals:   12/03/21 2331 12/04/21 0536 12/04/21 0606 12/04/21 0734  BP: (!) 123/58 (!) 107/47 122/60 121/66  Pulse: 76 63  68  Resp: 18 17    Temp: 98.3 F (36.8 C)   98.5 F (36.9 C)  TempSrc:      SpO2: 96% 98%  98%  Weight:      Height:        PHYSICAL EXAM:  Neurologically intact ABD soft Neurovascular intact Sensation intact distally Intact pulses distally Dorsiflexion/Plantar flexion intact Incision: dressing C/D/I No cellulitis present Compartment soft  LABS  No results found for this or any previous visit (from the past 24 hour(s)).  DG Knee Left Port  Result Date: 12/02/2021 CLINICAL DATA:  Status post revision of total knee replacement. EXAM: PORTABLE LEFT KNEE - 1-2 VIEW COMPARISON:  Radiograph 11/14/2021 FINDINGS: Revision left knee arthroplasty in expected alignment. No periprosthetic lucency or fracture. Recent postsurgical change includes air and edema in the soft tissues and joint space. Anterior skin staples in place. IMPRESSION: Revision left knee arthroplasty without immediate postoperative complication. Electronically Signed   By: Narda Rutherford M.D.   On: 12/02/2021 18:27    Assessment/Plan: 2 Days Post-Op   Principal Problem:   Status post revision of total knee replacement, left   Advance diet   [name] , PA-C 12/04/2021, 8:33 AM

## 2021-12-04 NOTE — Progress Notes (Signed)
Physical Therapy Treatment Patient Details Name: Jasmine Sullivan MRN: [id number] DOB: 09-14-1945 Today's Date: 12/04/2021   History of Present Illness 76 y/o female s/p L total knee revision (TKA ~2.5 years ago)    PT Comments    Pt continues to improve nicely POD2 knee revision.  She reports expected pain but is not limited by it and showed great WBing tolerance, knee mechanics, strength, ROM, etc.  She was easily able to circumambulate the nurses' station (x2) with consistent cadence and only minimal fatigue.  Pt doing well across the board, safe to d/c from PT stand point.     Recommendations for follow up therapy are one component of a multi-disciplinary discharge planning process, led by the attending physician.  Recommendations may be updated based on patient status, additional functional criteria and insurance authorization.  Follow Up Recommendations  Follow physician's recommendations for discharge plan and follow up therapies     Assistance Recommended at Discharge Intermittent Supervision/Assistance  Patient can return home with the following Assist for transportation;Assistance with cooking/housework;A little help with bathing/dressing/bathroom   Equipment Recommendations       Recommendations for Other Services       Precautions / Restrictions Precautions Precautions: Knee;Fall Precaution Booklet Issued: Yes (comment) Restrictions LLE Weight Bearing: Weight bearing as tolerated     Mobility  Bed Mobility Overal bed mobility: Modified Independent                  Transfers Overall transfer level: Modified independent Equipment used: Rolling walker (2 wheels)               General transfer comment: Pt was able to rise to standing w/o assist, good overall confidence and minimal need for cuing.    Ambulation/Gait Ambulation/Gait assistance: Supervision Gait Distance (Feet): 350 Feet Assistive device: Rolling walker (2 wheels)          General Gait Details: consistent cadence, good L knee mechanics, minimal/appropriate UE reliance or fatigue, no LOBs   Stairs             Wheelchair Mobility    Modified Rankin (Stroke Patients Only)       Balance Overall balance assessment: Modified Independent                                          Cognition Arousal/Alertness: Awake/alert Behavior During Therapy: WFL for tasks assessed/performed Overall Cognitive Status: Within Functional Limits for tasks assessed                                          Exercises Total Joint Exercises Heel Slides: Strengthening, 10 reps Hip ABduction/ADduction: Strengthening, 10 reps Straight Leg Raises: AROM, 10 reps Long Arc Quad: Strengthening, 10 reps Knee Flexion: Strengthening, 10 reps Goniometric ROM: 0-107    General Comments        Pertinent Vitals/Pain Pain Assessment Pain Assessment: 0-10 Pain Score: 6     Home Living                          Prior Function            PT Goals (current goals can now be found in the care plan section) Progress towards PT goals: Progressing toward goals  Frequency    BID      PT Plan Current plan remains appropriate    Co-evaluation              AM-PAC PT "6 Clicks" Mobility   Outcome Measure  Help needed turning from your back to your side while in a flat bed without using bedrails?: None Help needed moving from lying on your back to sitting on the side of a flat bed without using bedrails?: None Help needed moving to and from a bed to a chair (including a wheelchair)?: None Help needed standing up from a chair using your arms (e.g., wheelchair or bedside chair)?: None Help needed to walk in hospital room?: None Help needed climbing 3-5 steps with a railing? : None 6 Click Score: 24    End of Session Equipment Utilized During Treatment: Gait belt Activity Tolerance: Patient tolerated treatment  well Patient left: in chair;with call bell/phone within reach   PT Visit Diagnosis: Muscle weakness (generalized) (M62.81);Difficulty in walking, not elsewhere classified (R26.2);Pain Pain - Right/Left: Left Pain - part of body: Knee     Time: 8616-8372 PT Time Calculation (min) (ACUTE ONLY): 27 min  Charges:  $Gait Training: 8-22 mins $Therapeutic Exercise: 8-22 mins                     Malachi Pro, DPT 12/04/2021, 10:11 AM

## 2021-12-04 NOTE — Discharge Summary (Signed)
Physician Discharge Summary  Patient ID: Jasmine Sullivan MRN: [id number] DOB/AGE: 07/11/1945 76 y.o.  Admit date: 12/02/2021 Discharge date: 12/04/2021  Admission Diagnoses:  M25.462 Effusion, left knee T84.84XD Pain due to internal orthopedic prosth dev/grft, subs Status post revision of total knee replacement, left  Discharge Diagnoses:  M25.462 Effusion, left knee T84.84XD Pain due to internal orthopedic prosth dev/grft, subs Principal Problem:   Status post revision of total knee replacement, left   Past Medical History:  Diagnosis Date   Arthritis    Asthma    Complication of anesthesia    Benadryl [Diphenhydramine] recieved after surgery and was difficult to wake up   COPD (chronic obstructive pulmonary disease) (HCC)    Dyspnea    DOE   Fatty liver    Glaucoma    Heart murmur    History of methicillin resistant staphylococcus aureus (MRSA) 2000   Hypertension     Surgeries: Procedure(s): TOTAL KNEE REVISION on 12/02/2021   Consultants (if any):   Discharged Condition: Improved  Hospital Course: Jasmine Sullivan is an 76 y.o. female who was admitted 12/02/2021 with a diagnosis of  M25.462 Effusion, left knee T84.84XD Pain due to internal orthopedic prosth dev/grft, subs Status post revision of total knee replacement, left and went to the operating room on 12/02/2021 and underwent the above named procedures.    She was given perioperative antibiotics:  Anti-infectives (From admission, onward)    Start     Dose/Rate Route Frequency Ordered Stop   12/02/21 2100  ceFAZolin (ANCEF) IVPB 1 g/50 mL premix        1 g 100 mL/hr over 30 Minutes Intravenous Every 6 hours 12/02/21 1900 12/03/21 0424   12/02/21 1304  ceFAZolin (ANCEF) 2-4 GM/100ML-% IVPB       Note to Pharmacy: Agnes Lawrence A: cabinet override      12/02/21 1304 12/02/21 1505   12/02/21 0600  ceFAZolin (ANCEF) IVPB 2g/100 mL premix        2 g 200 mL/hr over 30 Minutes Intravenous On call to O.R.  12/02/21 0151 12/02/21 1512     .  She was given sequential compression devices, early ambulation, and  aspirin 81 mg twice daily for 30 days  for DVT prophylaxis.  She benefited maximally from the hospital stay and there were no complications.    Recent vital signs:  Vitals:   12/04/21 0606 12/04/21 0734  BP: 122/60 121/66  Pulse:  68  Resp:    Temp:  98.5 F (36.9 C)  SpO2:  98%    Recent laboratory studies:  Lab Results  Component Value Date   HGB 14.9 11/14/2021   HGB 13.4 05/09/2019   HGB 15.0 05/09/2019   Lab Results  Component Value Date   WBC 12.5 (H) 11/14/2021   PLT 279 11/14/2021   Lab Results  Component Value Date   INR 1.1 03/30/2019   Lab Results  Component Value Date   NA 137 11/14/2021   K 4.1 11/14/2021   CL 105 11/14/2021   CO2 24 11/14/2021   BUN 13 11/14/2021   CREATININE 0.87 11/14/2021   GLUCOSE 143 (H) 11/14/2021    Discharge Medications:   Allergies as of 12/04/2021       Reactions   Paroxetine Hcl Itching   Simbrinza [brinzolamide-brimonidine] Other (See Comments)   Dry mouth, itching eyes, "eczema eyes" - tolerates with [place] eye drops   Benadryl [diphenhydramine]    Glaucoma (caused optic nerve damage)   Parke Simmers  Allergy] Swelling   And itching   Amlodipine Rash   Benzonatate Rash   Chlorhexidine Rash   Erythromycin Other (See Comments)   Stomach issues   Nickel Rash        Medication List     STOP taking these medications    aspirin EC 81 MG tablet Replaced by: aspirin 81 MG chewable tablet   ibuprofen 200 MG tablet Commonly known as: ADVIL   meloxicam 7.5 MG tablet Commonly known as: Mobic       TAKE these medications    albuterol 108 (90 Base) MCG/ACT inhaler Commonly known as: VENTOLIN HFA Inhale 1 puff every 6 (six) hours as needed into the lungs for wheezing or shortness of breath.   aspirin 81 MG chewable tablet Chew 1 tablet (81 mg total) by mouth 2 (two) times daily. Replaces:  aspirin EC 81 MG tablet   Calcium 600+D 600-800 MG-UNIT Tabs Generic drug: Calcium Carb-Cholecalciferol Take 1 tablet by mouth daily at 12 noon.   cetirizine 10 MG tablet Commonly known as: ZYRTEC Take 10 mg at bedtime by mouth.   clotrimazole-betamethasone cream Commonly known as: LOTRISONE Apply 1 application topically 2 (two) times daily as needed (eczema).   docusate sodium 100 MG capsule Commonly known as: Colace Take 1 capsule (100 mg total) by mouth 2 (two) times daily.   Fish Oil 1000 MG Caps Take 3,000 mg by mouth daily at 12 noon.   fluocinonide 0.05 % external solution Commonly known as: LIDEX Apply 1 Application topically as needed.   furosemide 20 MG tablet Commonly known as: LASIX Take 20 mg by mouth daily.   latanoprost 0.005 % ophthalmic solution Commonly known as: XALATAN Place 1 drop into both eyes daily. - Vizulta   methocarbamol 750 MG tablet Commonly known as: Robaxin-750 Take 1 tablet (750 mg total) by mouth every 8 (eight) hours as needed for muscle spasms.   montelukast 10 MG tablet Commonly known as: SINGULAIR Take 10 mg daily by mouth.   ondansetron 4 MG disintegrating tablet Commonly known as: ZOFRAN-ODT Take 1 tablet (4 mg total) by mouth every 6 (six) hours as needed for nausea or vomiting.   oxyCODONE 5 MG immediate release tablet Commonly known as: Roxicodone Take 1 tablet (5 mg total) by mouth every 4 (four) hours as needed for severe pain.   raloxifene 60 MG tablet Commonly known as: EVISTA Take 60 mg by mouth daily at 12 noon.   simvastatin 10 MG tablet Commonly known as: ZOCOR Take 10 mg at bedtime by mouth.   Systane Balance 0.6 % Soln Generic drug: Propylene Glycol Place 1 drop into both eyes 3 (three) times daily as needed (dry/irritated eyes.).   triamcinolone cream 0.1 % Commonly known as: KENALOG Apply 1 application topically 2 (two) times daily as needed (eczema).   valsartan 80 MG tablet Commonly known as:  DIOVAN Take 80 mg by mouth daily.   Vibegron 75 MG Tabs Take 75 mg by mouth daily.               Durable Medical Equipment  (From admission, onward)           Start     Ordered   12/04/21 0837  For home use only DME 3 n 1  Once        12/04/21 0837   12/04/21 0837  For home use only DME Walker rolling  Once       Question Answer Comment  Walker: With 5 Inch  Wheels   Patient needs a walker to treat with the following condition S/P revision of total knee, left      12/04/21 0837            Diagnostic Studies: DG Knee Left Port  Result Date: 12/02/2021 CLINICAL DATA:  Status post revision of total knee replacement. EXAM: PORTABLE LEFT KNEE - 1-2 VIEW COMPARISON:  Radiograph 11/14/2021 FINDINGS: Revision left knee arthroplasty in expected alignment. No periprosthetic lucency or fracture. Recent postsurgical change includes air and edema in the soft tissues and joint space. Anterior skin staples in place. IMPRESSION: Revision left knee arthroplasty without immediate postoperative complication. Electronically Signed   By: Narda Rutherford M.D.   On: 12/02/2021 18:27   DG Knee Complete 4 Views Left  Result Date: 11/14/2021 CLINICAL DATA:  Left knee pain and edema. Nonweightbearing due to pain. EXAM: LEFT KNEE - COMPLETE 4+ VIEW COMPARISON:  05/09/2019 FINDINGS: Postoperative left total knee arthroplasty including patellar femoral component. Arthroplasty components appear well seated. No evidence of acute fracture or dislocation. No focal bone lesion or bone destruction. There is a large left knee effusion. IMPRESSION: Left total knee arthroplasty. Components appear well seated. Large left knee effusion. No acute fractures identified. Electronically Signed   By: Burman Nieves M.D.   On: 11/14/2021 03:14    Disposition: Discharge disposition: 01-Home or Self Care            Signed: [name] ,PA-C 12/04/2021, 8:37 AM

## 2021-12-04 NOTE — Plan of Care (Signed)

## 2021-12-04 NOTE — Discharge Instructions (Signed)
Continue weight bear as tolerated on the left lower extremity.    Elevate the left lower extremity whenever possible and continue the polar care while elevating the extremity. Patient may shower. No bath or submerging the wound.    Take aspirin 81 mg twice daily for 30 days as directed for blood clot prevention.  Continue to work on knee range of motion exercises at home as instructed by physical therapy. Continue to use a walker for assistance with ambulation until cleared by physical therapy.  Call [phone] with any questions, such as fever > 101.5 degrees, drainage from the wound or shortness of breath. 

## 2021-12-12 DIAGNOSIS — Z96652 Presence of left artificial knee joint: Secondary | ICD-10-CM | POA: Diagnosis not present

## 2021-12-16 DIAGNOSIS — Z96659 Presence of unspecified artificial knee joint: Secondary | ICD-10-CM | POA: Diagnosis not present

## 2021-12-24 DIAGNOSIS — Z96659 Presence of unspecified artificial knee joint: Secondary | ICD-10-CM | POA: Diagnosis not present

## 2021-12-26 DIAGNOSIS — Z96659 Presence of unspecified artificial knee joint: Secondary | ICD-10-CM | POA: Diagnosis not present

## 2021-12-31 DIAGNOSIS — Z96659 Presence of unspecified artificial knee joint: Secondary | ICD-10-CM | POA: Diagnosis not present

## 2022-01-02 DIAGNOSIS — Z96659 Presence of unspecified artificial knee joint: Secondary | ICD-10-CM | POA: Diagnosis not present

## 2022-01-08 DIAGNOSIS — Z96659 Presence of unspecified artificial knee joint: Secondary | ICD-10-CM | POA: Diagnosis not present

## 2022-01-10 ENCOUNTER — Other Ambulatory Visit: Payer: Self-pay | Admitting: Obstetrics and Gynecology

## 2022-01-10 DIAGNOSIS — N3281 Overactive bladder: Secondary | ICD-10-CM

## 2022-01-12 DIAGNOSIS — Z96652 Presence of left artificial knee joint: Secondary | ICD-10-CM | POA: Diagnosis not present

## 2022-01-15 DIAGNOSIS — Z96659 Presence of unspecified artificial knee joint: Secondary | ICD-10-CM | POA: Diagnosis not present

## 2022-01-22 ENCOUNTER — Other Ambulatory Visit: Payer: Self-pay | Admitting: Obstetrics and Gynecology

## 2022-01-22 DIAGNOSIS — N3281 Overactive bladder: Secondary | ICD-10-CM

## 2022-01-23 DIAGNOSIS — Z96659 Presence of unspecified artificial knee joint: Secondary | ICD-10-CM | POA: Diagnosis not present

## 2022-01-26 ENCOUNTER — Other Ambulatory Visit: Payer: Self-pay | Admitting: Internal Medicine

## 2022-01-26 DIAGNOSIS — E782 Mixed hyperlipidemia: Secondary | ICD-10-CM | POA: Diagnosis not present

## 2022-01-26 DIAGNOSIS — R7302 Impaired glucose tolerance (oral): Secondary | ICD-10-CM | POA: Diagnosis not present

## 2022-01-26 DIAGNOSIS — Z1231 Encounter for screening mammogram for malignant neoplasm of breast: Secondary | ICD-10-CM

## 2022-01-26 DIAGNOSIS — I10 Essential (primary) hypertension: Secondary | ICD-10-CM | POA: Diagnosis not present

## 2022-01-27 DIAGNOSIS — R7302 Impaired glucose tolerance (oral): Secondary | ICD-10-CM | POA: Diagnosis not present

## 2022-01-27 DIAGNOSIS — I10 Essential (primary) hypertension: Secondary | ICD-10-CM | POA: Diagnosis not present

## 2022-01-27 DIAGNOSIS — E782 Mixed hyperlipidemia: Secondary | ICD-10-CM | POA: Diagnosis not present

## 2022-01-28 DIAGNOSIS — Z96659 Presence of unspecified artificial knee joint: Secondary | ICD-10-CM | POA: Diagnosis not present

## 2022-02-02 DIAGNOSIS — Z96659 Presence of unspecified artificial knee joint: Secondary | ICD-10-CM | POA: Diagnosis not present

## 2022-02-18 DIAGNOSIS — M1711 Unilateral primary osteoarthritis, right knee: Secondary | ICD-10-CM | POA: Diagnosis not present

## 2022-02-18 DIAGNOSIS — M2550 Pain in unspecified joint: Secondary | ICD-10-CM | POA: Diagnosis not present

## 2022-02-18 DIAGNOSIS — M25461 Effusion, right knee: Secondary | ICD-10-CM | POA: Diagnosis not present

## 2022-02-18 DIAGNOSIS — Z96652 Presence of left artificial knee joint: Secondary | ICD-10-CM | POA: Diagnosis not present

## 2022-02-23 ENCOUNTER — Encounter (INDEPENDENT_AMBULATORY_CARE_PROVIDER_SITE_OTHER): Payer: Self-pay

## 2022-02-27 DIAGNOSIS — Z96652 Presence of left artificial knee joint: Secondary | ICD-10-CM | POA: Diagnosis not present

## 2022-03-24 DIAGNOSIS — Z1159 Encounter for screening for other viral diseases: Secondary | ICD-10-CM | POA: Diagnosis not present

## 2022-03-24 DIAGNOSIS — Z7960 Long term (current) use of unspecified immunomodulators and immunosuppressants: Secondary | ICD-10-CM | POA: Diagnosis not present

## 2022-03-24 DIAGNOSIS — M159 Polyosteoarthritis, unspecified: Secondary | ICD-10-CM | POA: Diagnosis not present

## 2022-03-24 DIAGNOSIS — L409 Psoriasis, unspecified: Secondary | ICD-10-CM | POA: Diagnosis not present

## 2022-03-24 DIAGNOSIS — L4050 Arthropathic psoriasis, unspecified: Secondary | ICD-10-CM | POA: Diagnosis not present

## 2022-03-24 DIAGNOSIS — R768 Other specified abnormal immunological findings in serum: Secondary | ICD-10-CM | POA: Diagnosis not present

## 2022-04-06 DIAGNOSIS — Z0001 Encounter for general adult medical examination with abnormal findings: Secondary | ICD-10-CM | POA: Diagnosis not present

## 2022-04-06 DIAGNOSIS — Z23 Encounter for immunization: Secondary | ICD-10-CM | POA: Diagnosis not present

## 2022-04-06 DIAGNOSIS — Z739 Problem related to life management difficulty, unspecified: Secondary | ICD-10-CM | POA: Diagnosis not present

## 2022-04-06 DIAGNOSIS — E782 Mixed hyperlipidemia: Secondary | ICD-10-CM | POA: Diagnosis not present

## 2022-04-06 DIAGNOSIS — Z0000 Encounter for general adult medical examination without abnormal findings: Secondary | ICD-10-CM | POA: Diagnosis not present

## 2022-04-06 DIAGNOSIS — I10 Essential (primary) hypertension: Secondary | ICD-10-CM | POA: Diagnosis not present

## 2022-04-06 DIAGNOSIS — F411 Generalized anxiety disorder: Secondary | ICD-10-CM | POA: Diagnosis not present

## 2022-04-06 DIAGNOSIS — L4059 Other psoriatic arthropathy: Secondary | ICD-10-CM | POA: Diagnosis not present

## 2022-04-13 DIAGNOSIS — H401232 Low-tension glaucoma, bilateral, moderate stage: Secondary | ICD-10-CM | POA: Diagnosis not present

## 2022-04-17 ENCOUNTER — Ambulatory Visit
Admission: RE | Admit: 2022-04-17 | Discharge: 2022-04-17 | Disposition: A | Discharge status: Home / Self Care | Payer: Medicare PPO | Source: Ambulatory Visit | Attending: Internal Medicine | Admitting: Internal Medicine

## 2022-04-17 DIAGNOSIS — Z1231 Encounter for screening mammogram for malignant neoplasm of breast: Secondary | ICD-10-CM | POA: Insufficient documentation

## 2022-04-22 DIAGNOSIS — Z96652 Presence of left artificial knee joint: Secondary | ICD-10-CM | POA: Diagnosis not present

## 2022-05-08 DIAGNOSIS — H401232 Low-tension glaucoma, bilateral, moderate stage: Secondary | ICD-10-CM | POA: Diagnosis not present

## 2022-05-14 DIAGNOSIS — Z7960 Long term (current) use of unspecified immunomodulators and immunosuppressants: Secondary | ICD-10-CM | POA: Diagnosis not present

## 2022-05-14 DIAGNOSIS — R768 Other specified abnormal immunological findings in serum: Secondary | ICD-10-CM | POA: Diagnosis not present

## 2022-05-14 DIAGNOSIS — M159 Polyosteoarthritis, unspecified: Secondary | ICD-10-CM | POA: Diagnosis not present

## 2022-05-14 DIAGNOSIS — L409 Psoriasis, unspecified: Secondary | ICD-10-CM | POA: Diagnosis not present

## 2022-05-14 DIAGNOSIS — I7300 Raynaud's syndrome without gangrene: Secondary | ICD-10-CM | POA: Diagnosis not present

## 2022-05-14 DIAGNOSIS — L4050 Arthropathic psoriasis, unspecified: Secondary | ICD-10-CM | POA: Diagnosis not present

## 2022-05-22 DIAGNOSIS — Z20822 Contact with and (suspected) exposure to covid-19: Secondary | ICD-10-CM | POA: Diagnosis not present

## 2022-05-22 DIAGNOSIS — B349 Viral infection, unspecified: Secondary | ICD-10-CM | POA: Diagnosis not present

## 2022-05-22 DIAGNOSIS — J302 Other seasonal allergic rhinitis: Secondary | ICD-10-CM | POA: Diagnosis not present

## 2022-06-08 DIAGNOSIS — H401232 Low-tension glaucoma, bilateral, moderate stage: Secondary | ICD-10-CM | POA: Diagnosis not present

## 2022-06-16 ENCOUNTER — Other Ambulatory Visit
Admission: RE | Admit: 2022-06-16 | Discharge: 2022-06-16 | Disposition: A | Discharge status: Home / Self Care | Payer: Medicare PPO | Source: Ambulatory Visit | Attending: Rheumatology | Admitting: Rheumatology

## 2022-06-16 DIAGNOSIS — M25461 Effusion, right knee: Secondary | ICD-10-CM | POA: Insufficient documentation

## 2022-06-16 DIAGNOSIS — L409 Psoriasis, unspecified: Secondary | ICD-10-CM | POA: Diagnosis not present

## 2022-06-16 DIAGNOSIS — M159 Polyosteoarthritis, unspecified: Secondary | ICD-10-CM | POA: Diagnosis not present

## 2022-06-16 DIAGNOSIS — L4050 Arthropathic psoriasis, unspecified: Secondary | ICD-10-CM | POA: Diagnosis not present

## 2022-06-16 DIAGNOSIS — Z7960 Long term (current) use of unspecified immunomodulators and immunosuppressants: Secondary | ICD-10-CM | POA: Diagnosis not present

## 2022-06-16 LAB — SYNOVIAL CELL COUNT + DIFF, W/ CRYSTALS
Crystals, Fluid: NONE SEEN
Eosinophils-Synovial: 2 %
Lymphocytes-Synovial Fld: 11 %
Monocyte-Macrophage-Synovial Fluid: 26 %
Neutrophil, Synovial: 61 %
WBC, Synovial: 2395 /mm3 — ABNORMAL HIGH (ref 0–200)

## 2022-06-19 LAB — BODY FLUID CULTURE W GRAM STAIN: Culture: NO GROWTH

## 2022-07-06 ENCOUNTER — Encounter: Payer: Self-pay | Admitting: Internal Medicine

## 2022-07-06 ENCOUNTER — Ambulatory Visit (INDEPENDENT_AMBULATORY_CARE_PROVIDER_SITE_OTHER): Payer: Medicare PPO

## 2022-07-06 ENCOUNTER — Ambulatory Visit: Payer: Medicare PPO | Admitting: Internal Medicine

## 2022-07-06 VITALS — BP 128/64 | HR 83 | Ht 66.5 in | Wt 158.8 lb

## 2022-07-06 DIAGNOSIS — R7302 Impaired glucose tolerance (oral): Secondary | ICD-10-CM | POA: Diagnosis not present

## 2022-07-06 DIAGNOSIS — I10 Essential (primary) hypertension: Secondary | ICD-10-CM

## 2022-07-06 DIAGNOSIS — R053 Chronic cough: Secondary | ICD-10-CM

## 2022-07-06 DIAGNOSIS — L4050 Arthropathic psoriasis, unspecified: Secondary | ICD-10-CM | POA: Insufficient documentation

## 2022-07-06 DIAGNOSIS — E782 Mixed hyperlipidemia: Secondary | ICD-10-CM

## 2022-07-06 NOTE — Progress Notes (Signed)
Established Patient Office Visit  Subjective:  Patient ID: Apphia Plagman, female    DOB: May 20, 1945  Age: 77 y.o. MRN: [id number]  Chief Complaint  Patient presents with   Follow-up    3 month follow up    Patient comes in for her follow-up today.  Last month she was set suffering from a viral URI with cough and congestion of the chest.  Today she reports that she is almost completely better except she feels some congestion in her left upper lobe of the lung and then she coughs and brings up phlegm which is clear or white.  She can also feel some pleuritic type chest pain on the left upper lung area.  She is not having any fevers or chills, no sore throat, but does have mild postnasal drip. She is being treated for psoriatic arthritis by the rheumatologist.  She is currently on methotrexate and folic acid.  She is feeling much better and most of her joints.  Including her left knee joint status postsurgery is also showing an improvement in pain and swelling. Patient is fasting for blood work today.     Past Medical History:  Diagnosis Date   Arthritis    Asthma    Complication of anesthesia    Benadryl [Diphenhydramine] recieved after surgery and was difficult to wake up   COPD (chronic obstructive pulmonary disease) ([place])    Dyspnea    DOE   Fatty liver    Glaucoma    Heart murmur    History of methicillin resistant staphylococcus aureus (MRSA) 2000   Hypertension     Past Surgical History:  Procedure Laterality Date   CATARACT EXTRACTION W/PHACO Right 03/23/2017   Procedure: CATARACT EXTRACTION PHACO AND INTRAOCULAR LENS PLACEMENT ([place]);  Surgeon: Birder Robson, MD;  Location: [facility] OR[facility];  Service: Ophthalmology;  Laterality: Right;  [place] 00:36.7 AP% 13.2 CDE 4.84 Fluid Pack lot # [id number]   DILATION AND CURETTAGE OF UTERUS     SHOULER Left    rotator cuff   THORACOTOMY     WEDGE RESECTION   TOTAL KNEE ARTHROPLASTY Left 05/09/2019   Procedure: LEFT TOTAL KNEE  ARTHROPLASTY;  Surgeon: Hessie Knows, MD;  Location: [facility] OR[facility];  Service: Orthopedics;  Laterality: Left;   TOTAL KNEE REVISION Left 12/02/2021   Procedure: TOTAL KNEE REVISION;  Surgeon: Lovell Sheehan, MD;  Location: [facility] OR[facility];  Service: Orthopedics;  Laterality: Left;    Social History   Socioeconomic History   Marital status: Divorced    Spouse name: Not on file   Number of children: Not on file   Years of education: Not on file   Highest education level: Not on file  Occupational History   Not on file  Tobacco Use   Smoking status: Former   Smokeless tobacco: Never   Tobacco comments:    45 years ago  Vaping Use   Vaping Use: Never used  Substance and Sexual Activity   Alcohol use: Yes    Alcohol/week: 14.0 standard drinks of alcohol    Types: 14 Cans of beer per week   Drug use: Never   Sexual activity: Not on file  Other Topics Concern   Not on file  Social History Narrative   Lives alone   Social Determinants of Health   Financial Resource Strain: Not on file  Food Insecurity: Not on file  Transportation Needs: Not on file  Physical Activity: Not on file  Stress: Not on file  Social Connections:  Not on file  Intimate Partner Violence: Not on file    Family History  Problem Relation Age of Onset   Breast cancer Mother 74    Allergies  Allergen Reactions   Paroxetine Hcl Itching   Simbrinza [Brinzolamide-Brimonidine] Other (See Comments)    Dry mouth, itching eyes, "eczema eyes" - tolerates with Pazeo eye drops   Benadryl [Diphenhydramine]     Glaucoma (caused optic nerve damage)   Chlorhexidine Gluconate    Crab [Shellfish Allergy] Swelling    And itching   Amlodipine Rash   Benzonatate Rash   Chlorhexidine Rash   Erythromycin Other (See Comments)    Stomach issues   Nickel Rash    Review of Systems  Constitutional: Negative.   HENT: Negative.    Eyes: Negative.   Respiratory:  Positive for cough and sputum production. Negative for shortness  of breath and wheezing.   Cardiovascular: Negative.   Gastrointestinal: Negative.   Genitourinary: Negative.   Musculoskeletal:  Positive for joint pain.  Skin: Negative.   Neurological: Negative.   Psychiatric/Behavioral: Negative.         Objective:   BP 128/64   Pulse 83   Ht 5' 6.5" (1.689 m)   Wt 158 lb 12.8 oz (72 kg)   SpO2 97%   BMI 25.25 kg/m   Vitals:   07/06/22 1000  BP: 128/64  Pulse: 83  Height: 5' 6.5" (1.689 m)  Weight: 158 lb 12.8 oz (72 kg)  SpO2: 97%  BMI (Calculated): 25.25    Physical Exam Vitals and nursing note reviewed.  Constitutional:      Appearance: Normal appearance.  Cardiovascular:     Rate and Rhythm: Normal rate and regular rhythm.  Pulmonary:     Effort: Pulmonary effort is normal.     Breath sounds: Normal breath sounds.  Abdominal:     General: Abdomen is flat. Bowel sounds are normal.     Palpations: Abdomen is soft.  Musculoskeletal:        General: Swelling present. No tenderness, deformity or signs of injury. Normal range of motion.     Cervical back: Normal range of motion and neck supple.  Skin:    General: Skin is warm.  Neurological:     General: No focal deficit present.     Mental Status: She is alert and oriented to person, place, and time.      No results found for any visits on 07/06/22.  Recent Results (from the past 2160 hour(s))  Synovial cell count + diff, w/ crystals     Status: Abnormal   Collection Time: 06/16/22  9:49 AM  Result Value Ref Range   Color, Synovial RED (A) YELLOW   Appearance-Synovial CLOUDY (A) CLEAR   Crystals, Fluid NO CRYSTALS SEEN    WBC, Synovial 2,395 (H) 0 - 200 /cu mm   Neutrophil, Synovial 61 %   Lymphocytes-Synovial Fld 11 %   Monocyte-Macrophage-Synovial Fluid 26 %   Eosinophils-Synovial 2 %    Comment: Performed at [facility], [address], [place], [place] [address]  Body fluid culture w Gram Stain     Status: None   Collection Time: 06/16/22  9:49  AM   Specimen: Synovium; Body Fluid  Result Value Ref Range   Specimen Description      SYNOVIAL Performed at [facility], [address], [place], [place] [address]    Special Requests      [place] Performed at [facility]  [address] [place], [place] [address]    Gram Stain      FEW WBC PRESENT,BOTH PMN AND MONONUCLEAR NO ORGANISMS SEEN    Culture      NO GROWTH 3 DAYS Performed at [facility], [address], [place], [place] [address]    Report Status 06/19/2022 FINAL       Assessment & Plan:  Will get a chest x-ray today for her residual cough.  At present tree inhaler sample given.  Check labs.  Will discuss results once available. Problem List Items Addressed This Visit     Hyperlipidemia   Relevant Orders   Lipid Panel w/o Chol/HDL Ratio   Essential hypertension, benign   Relevant Orders   CMP14+EGFR   Impaired glucose tolerance   Relevant Orders   Hemoglobin A1c   Chronic cough - Primary   Relevant Orders   CBC With Differential   DG Chest 2 View   Psoriatic arthritis ([place])    Return in about 3 months (around 10/06/2022).   Total time spent: 30 minutes  Perrin Maltese, MD  07/06/2022

## 2022-07-07 ENCOUNTER — Other Ambulatory Visit: Payer: Medicare PPO

## 2022-07-07 DIAGNOSIS — E782 Mixed hyperlipidemia: Secondary | ICD-10-CM | POA: Diagnosis not present

## 2022-07-07 DIAGNOSIS — I10 Essential (primary) hypertension: Secondary | ICD-10-CM | POA: Diagnosis not present

## 2022-07-07 DIAGNOSIS — R7302 Impaired glucose tolerance (oral): Secondary | ICD-10-CM | POA: Diagnosis not present

## 2022-07-07 DIAGNOSIS — R053 Chronic cough: Secondary | ICD-10-CM | POA: Diagnosis not present

## 2022-07-08 LAB — CBC WITH DIFFERENTIAL
Basophils Absolute: 0 10*3/uL (ref 0.0–0.2)
Basos: 1 %
EOS (ABSOLUTE): 0.1 10*3/uL (ref 0.0–0.4)
Eos: 2 %
Hematocrit: 47.2 % — ABNORMAL HIGH (ref 34.0–46.6)
Hemoglobin: 15.2 g/dL (ref 11.1–15.9)
Immature Grans (Abs): 0 10*3/uL (ref 0.0–0.1)
Immature Granulocytes: 0 %
Lymphocytes Absolute: 1.5 10*3/uL (ref 0.7–3.1)
Lymphs: 24 %
MCH: 29.6 pg (ref 26.6–33.0)
MCHC: 32.2 g/dL (ref 31.5–35.7)
MCV: 92 fL (ref 79–97)
Monocytes Absolute: 0.5 10*3/uL (ref 0.1–0.9)
Monocytes: 8 %
Neutrophils Absolute: 4.2 10*3/uL (ref 1.4–7.0)
Neutrophils: 65 %
RBC: 5.13 x10E6/uL (ref 3.77–5.28)
RDW: 14.7 % (ref 11.7–15.4)
WBC: 6.4 10*3/uL (ref 3.4–10.8)

## 2022-07-08 LAB — HEMOGLOBIN A1C
Est. average glucose Bld gHb Est-mCnc: 131 mg/dL
Hgb A1c MFr Bld: 6.2 % — ABNORMAL HIGH (ref 4.8–5.6)

## 2022-07-08 LAB — CMP14+EGFR
ALT: 15 IU/L (ref 0–32)
AST: 18 IU/L (ref 0–40)
Albumin/Globulin Ratio: 1.9 (ref 1.2–2.2)
Albumin: 4.4 g/dL (ref 3.8–4.8)
Alkaline Phosphatase: 83 IU/L (ref 44–121)
BUN/Creatinine Ratio: 15 (ref 12–28)
BUN: 11 mg/dL (ref 8–27)
Bilirubin Total: 0.4 mg/dL (ref 0.0–1.2)
CO2: 25 mmol/L (ref 20–29)
Calcium: 9.2 mg/dL (ref 8.7–10.3)
Chloride: 102 mmol/L (ref 96–106)
Creatinine, Ser: 0.72 mg/dL (ref 0.57–1.00)
Globulin, Total: 2.3 g/dL (ref 1.5–4.5)
Glucose: 94 mg/dL (ref 70–99)
Potassium: 3.7 mmol/L (ref 3.5–5.2)
Sodium: 142 mmol/L (ref 134–144)
Total Protein: 6.7 g/dL (ref 6.0–8.5)
eGFR: 87 mL/min/{1.73_m2} (ref >59)

## 2022-07-08 LAB — LIPID PANEL W/O CHOL/HDL RATIO
Cholesterol, Total: 140 mg/dL (ref 100–199)
HDL: 46 mg/dL (ref >39)
LDL Chol Calc (NIH): 74 mg/dL (ref 0–99)
Triglycerides: 111 mg/dL (ref 0–149)
VLDL Cholesterol Cal: 20 mg/dL (ref 5–40)

## 2022-07-13 DIAGNOSIS — L4050 Arthropathic psoriasis, unspecified: Secondary | ICD-10-CM | POA: Diagnosis not present

## 2022-07-13 DIAGNOSIS — L409 Psoriasis, unspecified: Secondary | ICD-10-CM | POA: Diagnosis not present

## 2022-07-13 DIAGNOSIS — M159 Polyosteoarthritis, unspecified: Secondary | ICD-10-CM | POA: Diagnosis not present

## 2022-07-13 DIAGNOSIS — Z7960 Long term (current) use of unspecified immunomodulators and immunosuppressants: Secondary | ICD-10-CM | POA: Diagnosis not present

## 2022-07-17 ENCOUNTER — Telehealth: Payer: Self-pay

## 2022-07-17 NOTE — Patient Outreach (Signed)
  Care Coordination   07/17/2022 Name: Jasmine [place] MRN: [id number] DOB: [birth year]   Care Coordination Outreach Attempts:  An unsuccessful telephone outreach was attempted today to offer the patient information about available care coordination services as a benefit of their health plan. HIPAA compliant voice message left.  Follow Up Plan:  Additional outreach attempts will be made to offer the patient care coordination information and services.   Encounter Outcome:  No Answer   Care Coordination Interventions:  No, not indicated    Quinn Plowman [facility] [phone] direct line

## 2022-08-21 ENCOUNTER — Other Ambulatory Visit: Payer: Self-pay | Admitting: Internal Medicine

## 2022-08-21 DIAGNOSIS — N3281 Overactive bladder: Secondary | ICD-10-CM

## 2022-08-28 DIAGNOSIS — H401232 Low-tension glaucoma, bilateral, moderate stage: Secondary | ICD-10-CM | POA: Diagnosis not present

## 2022-09-24 DIAGNOSIS — H401232 Low-tension glaucoma, bilateral, moderate stage: Secondary | ICD-10-CM | POA: Diagnosis not present

## 2022-10-01 DIAGNOSIS — H269 Unspecified cataract: Secondary | ICD-10-CM | POA: Diagnosis not present

## 2022-10-01 DIAGNOSIS — H401232 Low-tension glaucoma, bilateral, moderate stage: Secondary | ICD-10-CM | POA: Diagnosis not present

## 2022-10-01 DIAGNOSIS — H5005 Alternating esotropia: Secondary | ICD-10-CM | POA: Diagnosis not present

## 2022-10-05 ENCOUNTER — Other Ambulatory Visit: Payer: Self-pay | Admitting: Internal Medicine

## 2022-10-05 DIAGNOSIS — J301 Allergic rhinitis due to pollen: Secondary | ICD-10-CM

## 2022-10-06 ENCOUNTER — Ambulatory Visit: Payer: Medicare PPO | Admitting: Internal Medicine

## 2022-10-13 DIAGNOSIS — Z7960 Long term (current) use of unspecified immunomodulators and immunosuppressants: Secondary | ICD-10-CM | POA: Diagnosis not present

## 2022-10-13 DIAGNOSIS — M159 Polyosteoarthritis, unspecified: Secondary | ICD-10-CM | POA: Diagnosis not present

## 2022-10-13 DIAGNOSIS — L409 Psoriasis, unspecified: Secondary | ICD-10-CM | POA: Diagnosis not present

## 2022-10-13 DIAGNOSIS — L4050 Arthropathic psoriasis, unspecified: Secondary | ICD-10-CM | POA: Diagnosis not present

## 2023-01-17 ENCOUNTER — Other Ambulatory Visit: Payer: Self-pay

## 2023-01-17 ENCOUNTER — Emergency Department
Admission: EM | Admit: 2023-01-17 | Discharge: 2023-01-17 | Disposition: A | Discharge status: Home / Self Care | Payer: Medicare PPO | Attending: Emergency Medicine | Admitting: Emergency Medicine

## 2023-01-17 ENCOUNTER — Emergency Department: Payer: Medicare PPO

## 2023-01-17 DIAGNOSIS — I10 Essential (primary) hypertension: Secondary | ICD-10-CM | POA: Diagnosis not present

## 2023-01-17 DIAGNOSIS — R29810 Facial weakness: Secondary | ICD-10-CM | POA: Diagnosis not present

## 2023-01-17 DIAGNOSIS — G510 Bell's palsy: Secondary | ICD-10-CM | POA: Diagnosis not present

## 2023-01-17 DIAGNOSIS — R42 Dizziness and giddiness: Secondary | ICD-10-CM | POA: Diagnosis not present

## 2023-01-17 DIAGNOSIS — H538 Other visual disturbances: Secondary | ICD-10-CM | POA: Diagnosis not present

## 2023-01-17 LAB — COMPREHENSIVE METABOLIC PANEL
ALT: 24 U/L (ref 0–44)
AST: 24 U/L (ref 15–41)
Albumin: 4.3 g/dL (ref 3.5–5.0)
Alkaline Phosphatase: 65 U/L (ref 38–126)
Anion gap: 11 (ref 5–15)
BUN: 13 mg/dL (ref 8–23)
CO2: 27 mmol/L (ref 22–32)
Calcium: 9.1 mg/dL (ref 8.9–10.3)
Chloride: 104 mmol/L (ref 98–111)
Creatinine, Ser: 0.66 mg/dL (ref 0.44–1.00)
GFR, Estimated: >60 mL/min (ref >60)
Glucose, Bld: 108 mg/dL — ABNORMAL HIGH (ref 70–99)
Potassium: 3.7 mmol/L (ref 3.5–5.1)
Sodium: 142 mmol/L (ref 135–145)
Total Bilirubin: 0.7 mg/dL (ref 0.3–1.2)
Total Protein: 7.2 g/dL (ref 6.5–8.1)

## 2023-01-17 LAB — DIFFERENTIAL
Abs Immature Granulocytes: 0.04 10*3/uL (ref 0.00–0.07)
Basophils Absolute: 0.1 10*3/uL (ref 0.0–0.1)
Basophils Relative: 1 %
Eosinophils Absolute: 0.1 10*3/uL (ref 0.0–0.5)
Eosinophils Relative: 1 %
Immature Granulocytes: 1 %
Lymphocytes Relative: 24 %
Lymphs Abs: 1.8 10*3/uL (ref 0.7–4.0)
Monocytes Absolute: 0.6 10*3/uL (ref 0.1–1.0)
Monocytes Relative: 8 %
Neutro Abs: 5 10*3/uL (ref 1.7–7.7)
Neutrophils Relative %: 65 %

## 2023-01-17 LAB — CBC
HCT: 47.7 % — ABNORMAL HIGH (ref 36.0–46.0)
Hemoglobin: 15.6 g/dL — ABNORMAL HIGH (ref 12.0–15.0)
MCH: 30.6 pg (ref 26.0–34.0)
MCHC: 32.7 g/dL (ref 30.0–36.0)
MCV: 93.7 fL (ref 80.0–100.0)
Platelets: 305 10*3/uL (ref 150–400)
RBC: 5.09 MIL/uL (ref 3.87–5.11)
RDW: 13.6 % (ref 11.5–15.5)
WBC: 7.6 10*3/uL (ref 4.0–10.5)
nRBC: 0 % (ref 0.0–0.2)

## 2023-01-17 MED ORDER — SODIUM CHLORIDE 0.9% FLUSH: 3.0000 mL | Freq: Once | INTRAVENOUS | Status: DC

## 2023-01-17 MED ORDER — VALACYCLOVIR HCL 1 G PO TABS: 1000.0000 mg | ORAL_TABLET | Freq: Every day | ORAL | 0 refills | Status: AC

## 2023-01-17 MED ORDER — PREDNISONE 20 MG PO TABS: 60.0000 mg | ORAL_TABLET | Freq: Every day | ORAL | 0 refills | Status: AC

## 2023-01-17 NOTE — ED Triage Notes (Addendum)
Pt comes with c/o left sided facial droop. Pt states she woke up this morning like this. Pt states she went to drink some water and it ran out of her mouth.  Pt states some dizziness. Pt states some blurry vision but does have glaucoma so not sure if that is from that too just like with the dizziness.  Pt denies any current weakness. Pt speaking in complete sentence. Pt ambulatory with steady gait.

## 2023-01-17 NOTE — Discharge Instructions (Addendum)
You were seen in the emergency room today for evaluation of your facial weakness.  I suspect you have something called Bell's palsy.  I sent a prescription for steroids and antiviral treatment to your pharmacy.  Use lubricating eyedrops to prevent injury to your eye and an eye patch at night. Follow-up with your primary care doctor and neurology for further evaluation.  Return to the ER immediately for any new or worsening symptoms.

## 2023-01-17 NOTE — ED Notes (Signed)
Pt to CT. MD at bedside evaluating patient.

## 2023-01-17 NOTE — ED Provider Notes (Signed)
[facility] Provider Note    Event Date/Time   First MD Initiated Contact with Patient 01/17/23 [phone]     (approximate)   History   Facial Droop   HPI  Jasmine [place] is a 77 year old female with history of psoriatic arthritis, HTN presenting to the emergency department for evaluation of facial droop.  Patient felt normal when she went to bed last night around 9 PM.  She woke up around 4 AM and noticed that she had a droop of the left side of her face.  She went to drink some water and it ran out of her mouth.  She has also noticed that she is having difficulty keeping her left eye closed.  Does report some blurred vision, not sure if this is related to her glaucoma or difficulty closing her eyes.  No double vision.  Mild GI illness a few weeks ago, no other recent illness.  No tick bites, does not live in a wooded area.  No history of similar.  No numbness, tingling, focal weakness.  Denies headache or dizziness on my evaluation.    Physical Exam   Triage Vital Signs: ED Triage Vitals  Encounter Vitals Group     BP 01/17/23 0931 (!) 157/97     Systolic BP Percentile --      Diastolic BP Percentile --      Pulse Rate 01/17/23 0931 85     Resp 01/17/23 0931 18     Temp 01/17/23 0931 97.7 F (36.5 C)     Temp src --      SpO2 01/17/23 0931 100 %     Weight --      Height --      Head Circumference --      Peak Flow --      Pain Score 01/17/23 0930 0     Pain Loc --      Pain Education --      Exclude from Growth Chart --     Most recent vital signs: Vitals:   01/17/23 0931  BP: (!) 157/97  Pulse: 85  Resp: 18  Temp: 97.7 F (36.5 C)  SpO2: 100%     General: Awake, interactive  CV:  Regular rate, good peripheral perfusion.  Resp:  Lungs clear, unlabored respirations.  Abd:  Soft, nondistended.  Neuro:  Keenly aware, correctly answers month and age, able to blink eyes and squeeze hands, normal horizontal extraocular movements, no  visual field loss, left-sided facial droop present.  Does have some preserved eyebrow elevation on the left side, but the eyebrow and lower lid are sagging compared to the contralateral side.  Is able to close her eye, but difficulty shutting it tight and can be readily open compared to her contralateral side.  No weakness or drooping of the right side of the face. No arm or leg motor drift, 5/5 strength in the bilateral upper and lower extremities, no limb ataxia, normal sensation, no aphasia, no dysarthria, no inattention    ED Results / Procedures / Treatments   Labs (all labs ordered are listed, but only abnormal results are displayed) Labs Reviewed  CBC - Abnormal; Notable for the following components:      Result Value   Hemoglobin 15.6 (*)    HCT 47.7 (*)    All other components within normal limits  COMPREHENSIVE METABOLIC PANEL - Abnormal; Notable for the following components:   Glucose, Bld 108 (*)    All other  components within normal limits  DIFFERENTIAL     EKG EKG independently reviewed interpreted by myself (ER attending) demonstrates:  EKG demonstrate sinus rhythm at a rate of 71, PR 232, QRS 91, QTc 446, no acute ST changes  RADIOLOGY Imaging independently reviewed and interpreted by myself demonstrates:  CT head without acute bleed  PROCEDURES:  Critical Care performed: No  Procedures   MEDICATIONS ORDERED IN ED: Medications - No data to display   IMPRESSION / MDM / ASSESSMENT AND PLAN / ED COURSE  I reviewed the triage vital signs and the nursing notes.  Differential diagnosis includes, but is not limited to, Bell's palsy, much lower suspicion CVA, consideration for herpes zoster, mono, Lyme disease  Patient's presentation is most consistent with acute presentation with potential threat to life or bodily function.  77 year old female presenting with acute onset facial droop.  Last known well outside the window for intervention and without evidence of  large vessel occlusion.    Physical exam seems most consistent with a Bell's palsy, but with her age and risk factors will obtain some basic labs and head CT.  Labs without significant derangement.  CT head reassuring, did discuss that this does not rule out a stroke.  On reevaluation, patient does have a more prominent left-sided eyelid droop, but does still maintain some eyebrow elevation, weak compared to contralateral side.  Her clinical presentation is most consistent with Bell's palsy.  She has presented within 72 hours of symptoms, so will DC with prescription for steroids and antiviral therapy.  She has lubricating eyedrops that she will use at home and we will send her home with an eye patch to use at night.  Strict return precautions provided.  Patient discharged stable condition.    FINAL CLINICAL IMPRESSION(S) / ED DIAGNOSES   Final diagnoses:  Bell's palsy     Rx / DC Orders   ED Discharge Orders          Ordered    predniSONE (DELTASONE) 20 MG tablet  Daily with breakfast        01/17/23 1058    valACYclovir (VALTREX) 1000 MG tablet  Daily        01/17/23 1058             Note:  This document was prepared using Dragon voice recognition software and may include unintentional dictation errors.   Trinna Post, MD 01/17/23 1058

## 2023-01-19 ENCOUNTER — Ambulatory Visit: Payer: Medicare PPO | Admitting: Internal Medicine

## 2023-01-19 ENCOUNTER — Encounter: Payer: Self-pay | Admitting: Internal Medicine

## 2023-01-19 VITALS — BP 142/82 | HR 78 | Ht 66.5 in | Wt 163.8 lb

## 2023-01-19 DIAGNOSIS — G510 Bell's palsy: Secondary | ICD-10-CM

## 2023-01-19 DIAGNOSIS — E782 Mixed hyperlipidemia: Secondary | ICD-10-CM | POA: Diagnosis not present

## 2023-01-19 DIAGNOSIS — I10 Essential (primary) hypertension: Secondary | ICD-10-CM | POA: Diagnosis not present

## 2023-01-19 NOTE — Progress Notes (Signed)
Established Patient Office Visit  Subjective:  Patient ID: Jasmine [place], female    DOB: 09-Oct-1945  Age: 77 y.o. MRN: [id number]  Chief Complaint  Patient presents with   Hospitalization Follow-up    Hospital follow up    Patient comes in for hospital follow-up.  2 days ago she went to emergency room where she was diagnosed with left-sided Bell's palsy.  Her workup was entirely negative.  She was sent home on prednisone and Valtrex. She still has the left facial droop, and left eye does not close completely. Today she feels stable, has no new complaints and is taking her medications regularly. She has been putting moisture eyedrops but did not use an eye patch on her left eye, however will start using it from today.    No other concerns at this time.   Past Medical History:  Diagnosis Date   Arthritis    Asthma    Complication of anesthesia    Benadryl [Diphenhydramine] recieved after surgery and was difficult to wake up   COPD (chronic obstructive pulmonary disease) ([facility])    Dyspnea    DOE   Fatty liver    Glaucoma    Heart murmur    History of methicillin resistant staphylococcus aureus (MRSA) 2000   Hypertension     Past Surgical History:  Procedure Laterality Date   CATARACT EXTRACTION W/PHACO Right 03/23/2017   Procedure: CATARACT EXTRACTION PHACO AND INTRAOCULAR LENS PLACEMENT ([facility]);  Surgeon: Galen Manila, MD;  Location: [facility] OR[facility];  Service: Ophthalmology;  Laterality: Right;  [place] 00:36.7 AP% 13.2 CDE 4.84 Fluid Pack lot # [id number]   DILATION AND CURETTAGE OF UTERUS     SHOULER Left    rotator cuff   THORACOTOMY     WEDGE RESECTION   TOTAL KNEE ARTHROPLASTY Left 05/09/2019   Procedure: LEFT TOTAL KNEE ARTHROPLASTY;  Surgeon: Kennedy Bucker, MD;  Location: [facility];  Service: Orthopedics;  Laterality: Left;   TOTAL KNEE REVISION Left 12/02/2021   Procedure: TOTAL KNEE REVISION;  Surgeon: Lyndle Herrlich, MD;  Location: [facility];  Service: Orthopedics;   Laterality: Left;    Social History   Socioeconomic History   Marital status: Divorced    Spouse name: Not on file   Number of children: Not on file   Years of education: Not on file   Highest education level: Not on file  Occupational History   Not on file  Tobacco Use   Smoking status: Former   Smokeless tobacco: Never   Tobacco comments:    45 years ago  Vaping Use   Vaping status: Never Used  Substance and Sexual Activity   Alcohol use: Yes    Alcohol/week: 14.0 standard drinks of alcohol    Types: 14 Cans of beer per week   Drug use: Never   Sexual activity: Not on file  Other Topics Concern   Not on file  Social History Narrative   Lives alone   Social Determinants of Health   Financial Resource Strain: Not on file  Food Insecurity: Not on file  Transportation Needs: Not on file  Physical Activity: Not on file  Stress: Not on file  Social Connections: Not on file  Intimate Partner Violence: Not on file    Family History  Problem Relation Age of Onset   Breast cancer Mother 58    Allergies  Allergen Reactions   Paroxetine Hcl Itching   Simbrinza [Brinzolamide-Brimonidine] Other (See Comments)    Dry mouth, itching  eyes, "eczema eyes" - tolerates with [place]o eye drops   Benadryl [Diphenhydramine]     Glaucoma (caused optic nerve damage)   Chlorhexidine Gluconate    Crab [Shellfish Allergy] Swelling    And itching   Amlodipine Rash   Benzonatate Rash   Chlorhexidine Rash   Erythromycin Other (See Comments)    Stomach issues   Nickel Rash    Review of Systems  Constitutional: Negative.  Negative for chills, diaphoresis, fever, malaise/fatigue and weight loss.  HENT: Negative.  Negative for ear discharge and sore throat.   Eyes: Negative.  Negative for photophobia, pain, discharge and redness.  Respiratory: Negative.  Negative for cough, shortness of breath and stridor.   Cardiovascular: Negative.  Negative for chest pain, palpitations and leg  swelling.  Gastrointestinal: Negative.  Negative for abdominal pain, constipation, diarrhea, heartburn, nausea and vomiting.  Genitourinary: Negative.  Negative for dysuria and flank pain.  Musculoskeletal: Negative.  Negative for joint pain and myalgias.  Skin: Negative.   Neurological:  Positive for weakness (Left facial droop). Negative for dizziness and headaches.  Endo/Heme/Allergies: Negative.   Psychiatric/Behavioral: Negative.  Negative for depression and suicidal ideas. The patient is not nervous/anxious.        Objective:   BP (!) 142/82   Pulse 78   Ht 5' 6.5" (1.689 m)   Wt 163 lb 12.8 oz (74.3 kg)   SpO2 97%   BMI 26.04 kg/m   Vitals:   01/19/23 1038  BP: (!) 142/82  Pulse: 78  Height: 5' 6.5" (1.689 m)  Weight: 163 lb 12.8 oz (74.3 kg)  SpO2: 97%  BMI (Calculated): 26.04    Physical Exam Vitals and nursing note reviewed.  Constitutional:      Appearance: Normal appearance.  HENT:     Head: Normocephalic and atraumatic.     Nose: Nose normal.     Mouth/Throat:     Mouth: Mucous membranes are moist.     Pharynx: Oropharynx is clear.  Eyes:     Conjunctiva/sclera: Conjunctivae normal.     Pupils: Pupils are equal, round, and reactive to light.  Cardiovascular:     Rate and Rhythm: Normal rate and regular rhythm.     Pulses: Normal pulses.     Heart sounds: Normal heart sounds. No murmur heard. Pulmonary:     Effort: Pulmonary effort is normal.     Breath sounds: Normal breath sounds. No wheezing.  Abdominal:     General: Bowel sounds are normal.     Palpations: Abdomen is soft.     Tenderness: There is no abdominal tenderness. There is no right CVA tenderness or left CVA tenderness.  Musculoskeletal:        General: Normal range of motion.     Cervical back: Normal range of motion.     Right lower leg: No edema.     Left lower leg: No edema.  Skin:    General: Skin is warm and dry.  Neurological:     General: No focal deficit present.      Mental Status: She is alert and oriented to person, place, and time.  Psychiatric:        Mood and Affect: Mood normal.        Behavior: Behavior normal.      No results found for any visits on 01/19/23.  Recent Results (from the past 2160 hour(s))  CBC     Status: Abnormal   Collection Time: 01/17/23 10:04 AM  Result Value Ref  Range   WBC 7.6 4.0 - 10.5 K/uL   RBC 5.09 3.87 - 5.11 MIL/uL   Hemoglobin 15.6 (H) 12.0 - 15.0 g/dL   HCT 34.7 (H) 42.5 - 95.6 %   MCV 93.7 80.0 - 100.0 fL   MCH 30.6 26.0 - 34.0 pg   MCHC 32.7 30.0 - 36.0 g/dL   RDW 38.7 56.4 - 33.2 %   Platelets 305 150 - 400 K/uL   nRBC 0.0 0.0 - 0.2 %    Comment: Performed at [facility], [address] [place], [place] [address]  Differential     Status: None   Collection Time: 01/17/23 10:04 AM  Result Value Ref Range   Neutrophils Relative % 65 %   Neutro Abs 5.0 1.7 - 7.7 K/uL   Lymphocytes Relative 24 %   Lymphs Abs 1.8 0.7 - 4.0 K/uL   Monocytes Relative 8 %   Monocytes Absolute 0.6 0.1 - 1.0 K/uL   Eosinophils Relative 1 %   Eosinophils Absolute 0.1 0.0 - 0.5 K/uL   Basophils Relative 1 %   Basophils Absolute 0.1 0.0 - 0.1 K/uL   Immature Granulocytes 1 %   Abs Immature Granulocytes 0.04 0.00 - 0.07 K/uL    Comment: Performed at [facility], [address] [place], [place] [address]  Comprehensive metabolic panel     Status: Abnormal   Collection Time: 01/17/23 10:04 AM  Result Value Ref Range   Sodium 142 135 - 145 mmol/L   Potassium 3.7 3.5 - 5.1 mmol/L   Chloride 104 98 - 111 mmol/L   CO2 27 22 - 32 mmol/L   Glucose, Bld 108 (H) 70 - 99 mg/dL    Comment: Glucose reference range applies only to samples taken after fasting for at least 8 hours.   BUN 13 8 - 23 mg/dL   Creatinine, Ser 6.30 0.44 - 1.00 mg/dL   Calcium 9.1 8.9 - 16.0 mg/dL   Total Protein 7.2 6.5 - 8.1 g/dL   Albumin 4.3 3.5 - 5.0 g/dL   AST 24 15 - 41 U/L   ALT 24 0 - 44 U/L   Alkaline Phosphatase 65  38 - 126 U/L   Total Bilirubin 0.7 0.3 - 1.2 mg/dL   GFR, Estimated >10 >93 mL/min    Comment: (NOTE) Calculated using the CKD-EPI Creatinine Equation (2021)    Anion gap 11 5 - 15    Comment: Performed at [facility], [address], [place], [place] [address]      Assessment & Plan:  Continue current treatment. Problem List Items Addressed This Visit     Hyperlipidemia   Essential hypertension, benign   Other Visit Diagnoses     Left-sided Bell's palsy    -  Primary       Return in about 2 weeks (around 02/02/2023).   Total time spent: 30 minutes  Margaretann Loveless, MD  01/19/2023   This document may have been prepared by [facility] Voice Recognition software and as such may include unintentional dictation errors.

## 2023-02-02 ENCOUNTER — Ambulatory Visit: Payer: Medicare PPO | Admitting: Internal Medicine

## 2023-02-02 ENCOUNTER — Encounter: Payer: Self-pay | Admitting: Internal Medicine

## 2023-02-02 VITALS — BP 126/70 | HR 68 | Ht 66.5 in | Wt 165.0 lb

## 2023-02-02 DIAGNOSIS — Z23 Encounter for immunization: Secondary | ICD-10-CM | POA: Diagnosis not present

## 2023-02-02 DIAGNOSIS — Z1231 Encounter for screening mammogram for malignant neoplasm of breast: Secondary | ICD-10-CM

## 2023-02-02 DIAGNOSIS — G510 Bell's palsy: Secondary | ICD-10-CM

## 2023-02-02 DIAGNOSIS — E782 Mixed hyperlipidemia: Secondary | ICD-10-CM

## 2023-02-02 DIAGNOSIS — I10 Essential (primary) hypertension: Secondary | ICD-10-CM

## 2023-02-02 DIAGNOSIS — Z860109 Personal history of other colon polyps: Secondary | ICD-10-CM

## 2023-02-02 NOTE — Progress Notes (Signed)
Established Patient Office Visit  Subjective:  Patient ID: Jasmine Sullivan, female    DOB: 1945/06/04  Age: 77 y.o. MRN: [id number]  Chief Complaint  Patient presents with   Follow-up    2 week follow up    Patient comes in for follow-up of her Bell's palsy of the left side of face.  She is actually feeling better and has pretty decent improvement of her left-sided facial droop and left eyelid.  She has completed her prednisone and Valtrex treatment. Still has some tearing from the left eye and double vision. Has an appointment to see her eye doctor in near future. Needs GI referral for colonoscopy-sent.    No other concerns at this time.   Past Medical History:  Diagnosis Date   Arthritis    Asthma    Complication of anesthesia    Benadryl [Diphenhydramine] recieved after surgery and was difficult to wake up   COPD (chronic obstructive pulmonary disease) (HCC)    Dyspnea    DOE   Fatty liver    Glaucoma    Heart murmur    History of methicillin resistant staphylococcus aureus (MRSA) 2000   Hypertension     Past Surgical History:  Procedure Laterality Date   CATARACT EXTRACTION W/PHACO Right 03/23/2017   Procedure: CATARACT EXTRACTION PHACO AND INTRAOCULAR LENS PLACEMENT (IOC);  Surgeon: Galen Manila, MD;  Location: [facility];  Service: Ophthalmology;  Laterality: Right;  [place] 00:36.7 AP% 13.2 CDE 4.84 Fluid Pack lot # [id number]   DILATION AND CURETTAGE OF UTERUS     SHOULER Left    rotator cuff   THORACOTOMY     WEDGE RESECTION   TOTAL KNEE ARTHROPLASTY Left 05/09/2019   Procedure: LEFT TOTAL KNEE ARTHROPLASTY;  Surgeon: Kennedy Bucker, MD;  Location: [facility];  Service: Orthopedics;  Laterality: Left;   TOTAL KNEE REVISION Left 12/02/2021   Procedure: TOTAL KNEE REVISION;  Surgeon: Lyndle Herrlich, MD;  Location: [facility];  Service: Orthopedics;  Laterality: Left;    Social History   Socioeconomic History   Marital status: Divorced    Spouse name: Not on  file   Number of children: Not on file   Years of education: Not on file   Highest education level: Not on file  Occupational History   Not on file  Tobacco Use   Smoking status: Former   Smokeless tobacco: Never   Tobacco comments:    45 years ago  Vaping Use   Vaping status: Never Used  Substance and Sexual Activity   Alcohol use: Yes    Alcohol/week: 14.0 standard drinks of alcohol    Types: 14 Cans of beer per week   Drug use: Never   Sexual activity: Not on file  Other Topics Concern   Not on file  Social History Narrative   Lives alone   Social Determinants of Health   Financial Resource Strain: Not on file  Food Insecurity: Not on file  Transportation Needs: Not on file  Physical Activity: Not on file  Stress: Not on file  Social Connections: Not on file  Intimate Partner Violence: Not on file    Family History  Problem Relation Age of Onset   Breast cancer Mother 90    Allergies  Allergen Reactions   Paroxetine Hcl Itching   Simbrinza [Brinzolamide-Brimonidine] Other (See Comments)    Dry mouth, itching eyes, "eczema eyes" - tolerates with [place]azeo eye drops   Benadryl [Diphenhydramine]     Glaucoma (caused optic  nerve damage)   Chlorhexidine Gluconate    Crab [Shellfish Allergy] Swelling    And itching   Amlodipine Rash   Benzonatate Rash   Chlorhexidine Rash   Erythromycin Other (See Comments)    Stomach issues   Nickel Rash    Review of Systems  Constitutional: Negative.   HENT: Negative.    Eyes:  Positive for double vision and discharge.  Respiratory: Negative.  Negative for cough and shortness of breath.   Cardiovascular: Negative.  Negative for chest pain, palpitations and leg swelling.  Gastrointestinal: Negative.  Negative for abdominal pain, constipation, diarrhea, heartburn, nausea and vomiting.  Genitourinary: Negative.  Negative for dysuria and flank pain.  Musculoskeletal: Negative.  Negative for joint pain and myalgias.  Skin:  Negative.   Neurological:  Positive for focal weakness. Negative for dizziness and headaches.  Endo/Heme/Allergies: Negative.   Psychiatric/Behavioral: Negative.  Negative for depression and suicidal ideas. The patient is not nervous/anxious.        Objective:   BP 126/70   Pulse 68   Ht 5' 6.5" (1.689 m)   Wt 165 lb (74.8 kg)   SpO2 96%   BMI 26.23 kg/m   Vitals:   02/02/23 1008  BP: 126/70  Pulse: 68  Height: 5' 6.5" (1.689 m)  Weight: 165 lb (74.8 kg)  SpO2: 96%  BMI (Calculated): 26.24    Physical Exam Vitals and nursing note reviewed.  Constitutional:      Appearance: Normal appearance.  HENT:     Head: Normocephalic and atraumatic.     Nose: Nose normal.     Mouth/Throat:     Mouth: Mucous membranes are moist.     Pharynx: Oropharynx is clear.  Eyes:     Conjunctiva/sclera: Conjunctivae normal.     Pupils: Pupils are equal, round, and reactive to light.  Cardiovascular:     Rate and Rhythm: Normal rate and regular rhythm.     Pulses: Normal pulses.     Heart sounds: Normal heart sounds. No murmur heard. Pulmonary:     Effort: Pulmonary effort is normal.     Breath sounds: Normal breath sounds. No wheezing.  Abdominal:     General: Bowel sounds are normal.     Palpations: Abdomen is soft.     Tenderness: There is no abdominal tenderness. There is no right CVA tenderness or left CVA tenderness.  Musculoskeletal:        General: Normal range of motion.     Cervical back: Normal range of motion.     Right lower leg: No edema.     Left lower leg: No edema.  Skin:    General: Skin is warm and dry.  Neurological:     General: No focal deficit present.     Mental Status: She is alert and oriented to person, place, and time.  Psychiatric:        Mood and Affect: Mood normal.        Behavior: Behavior normal.      No results found for any visits on 02/02/23.  Recent Results (from the past 2160 hour(s))  CBC     Status: Abnormal   Collection Time:  01/17/23 10:04 AM  Result Value Ref Range   WBC 7.6 4.0 - 10.5 K/uL   RBC 5.09 3.87 - 5.11 MIL/uL   Hemoglobin 15.6 (H) 12.0 - 15.0 g/dL   HCT 91.4 (H) 78.2 - 95.6 %   MCV 93.7 80.0 - 100.0 fL   MCH 30.6  26.0 - 34.0 pg   MCHC 32.7 30.0 - 36.0 g/dL   RDW 09.8 11.9 - 14.7 %   Platelets 305 150 - 400 K/uL   nRBC 0.0 0.0 - 0.2 %    Comment: Performed at [facility], [address] [place], [place] [address]  Differential     Status: None   Collection Time: 01/17/23 10:04 AM  Result Value Ref Range   Neutrophils Relative % 65 %   Neutro Abs 5.0 1.7 - 7.7 K/uL   Lymphocytes Relative 24 %   Lymphs Abs 1.8 0.7 - 4.0 K/uL   Monocytes Relative 8 %   Monocytes Absolute 0.6 0.1 - 1.0 K/uL   Eosinophils Relative 1 %   Eosinophils Absolute 0.1 0.0 - 0.5 K/uL   Basophils Relative 1 %   Basophils Absolute 0.1 0.0 - 0.1 K/uL   Immature Granulocytes 1 %   Abs Immature Granulocytes 0.04 0.00 - 0.07 K/uL    Comment: Performed at [facility], [address] [place], [place] [address]  Comprehensive metabolic panel     Status: Abnormal   Collection Time: 01/17/23 10:04 AM  Result Value Ref Range   Sodium 142 135 - 145 mmol/L   Potassium 3.7 3.5 - 5.1 mmol/L   Chloride 104 98 - 111 mmol/L   CO2 27 22 - 32 mmol/L   Glucose, Bld 108 (H) 70 - 99 mg/dL    Comment: Glucose reference range applies only to samples taken after fasting for at least 8 hours.   BUN 13 8 - 23 mg/dL   Creatinine, Ser 6.57 0.44 - 1.00 mg/dL   Calcium 9.1 8.9 - 84.6 mg/dL   Total Protein 7.2 6.5 - 8.1 g/dL   Albumin 4.3 3.5 - 5.0 g/dL   AST 24 15 - 41 U/L   ALT 24 0 - 44 U/L   Alkaline Phosphatase 65 38 - 126 U/L   Total Bilirubin 0.7 0.3 - 1.2 mg/dL   GFR, Estimated >96 >29 mL/min    Comment: (NOTE) Calculated using the CKD-EPI Creatinine Equation (2021)    Anion gap 11 5 - 15    Comment: Performed at [facility], [address], [place], [place] [address]      Assessment & Plan:   Continue current management.  Problem List Items Addressed This Visit     Hyperlipidemia   Essential hypertension, benign   Other Visit Diagnoses     Left-sided Bell's palsy    -  Primary   Personal history of other colon polyps       Relevant Orders   Ambulatory referral to Gastroenterology   Need for immunization against influenza       Relevant Orders   Flu Vaccine Trivalent High Dose (Fluad) (Completed)   Breast cancer screening by mammogram       Relevant Orders   MM 3D SCREENING MAMMOGRAM BILATERAL BREAST       Return in about 2 months (around 04/04/2023).   Total time spent: 25 minutes  Margaretann Loveless, MD  02/02/2023   This document may have been prepared by [facility] Voice Recognition software and as such may include unintentional dictation errors.

## 2023-02-09 DIAGNOSIS — G510 Bell's palsy: Secondary | ICD-10-CM | POA: Diagnosis not present

## 2023-02-09 DIAGNOSIS — H538 Other visual disturbances: Secondary | ICD-10-CM | POA: Diagnosis not present

## 2023-02-09 DIAGNOSIS — Z7689 Persons encountering health services in other specified circumstances: Secondary | ICD-10-CM | POA: Diagnosis not present

## 2023-02-15 DIAGNOSIS — L4050 Arthropathic psoriasis, unspecified: Secondary | ICD-10-CM | POA: Diagnosis not present

## 2023-02-15 DIAGNOSIS — Z7960 Long term (current) use of unspecified immunomodulators and immunosuppressants: Secondary | ICD-10-CM | POA: Diagnosis not present

## 2023-02-15 DIAGNOSIS — L409 Psoriasis, unspecified: Secondary | ICD-10-CM | POA: Diagnosis not present

## 2023-02-15 DIAGNOSIS — M150 Primary generalized (osteo)arthritis: Secondary | ICD-10-CM | POA: Diagnosis not present

## 2023-02-15 DIAGNOSIS — M7711 Lateral epicondylitis, right elbow: Secondary | ICD-10-CM | POA: Diagnosis not present

## 2023-03-21 ENCOUNTER — Other Ambulatory Visit: Payer: Self-pay | Admitting: Internal Medicine

## 2023-03-21 DIAGNOSIS — E782 Mixed hyperlipidemia: Secondary | ICD-10-CM

## 2023-04-05 ENCOUNTER — Ambulatory Visit: Payer: Medicare PPO | Admitting: Internal Medicine

## 2023-04-05 ENCOUNTER — Encounter: Payer: Self-pay | Admitting: Internal Medicine

## 2023-04-05 VITALS — BP 140/78 | HR 79 | Ht 66.5 in | Wt 168.0 lb

## 2023-04-05 DIAGNOSIS — Z1231 Encounter for screening mammogram for malignant neoplasm of breast: Secondary | ICD-10-CM

## 2023-04-05 DIAGNOSIS — L4050 Arthropathic psoriasis, unspecified: Secondary | ICD-10-CM | POA: Diagnosis not present

## 2023-04-05 DIAGNOSIS — E782 Mixed hyperlipidemia: Secondary | ICD-10-CM | POA: Diagnosis not present

## 2023-04-05 DIAGNOSIS — R7303 Prediabetes: Secondary | ICD-10-CM

## 2023-04-06 ENCOUNTER — Encounter: Payer: Self-pay | Admitting: Internal Medicine

## 2023-04-06 NOTE — Progress Notes (Signed)
Established Patient Office Visit  Subjective:  Patient ID: Maxima Schmeltz, female    DOB: 05/22/1945  Age: 77 y.o. MRN: [id number]  Chief Complaint  Patient presents with   Follow-up    2 mo    Patient comes in for her follow-up today.  Her Bell's palsy is almost completely resolved.  She feels happy about it. Scheduled for colonoscopy for next month.  Needs to schedule mammogram. Also to get labs today. BMD was done in 2023.    No other concerns at this time.   Past Medical History:  Diagnosis Date   Arthritis    Asthma    Complication of anesthesia    Benadryl [Diphenhydramine] recieved after surgery and was difficult to wake up   COPD (chronic obstructive pulmonary disease) (HCC)    Dyspnea    DOE   Fatty liver    Glaucoma    Heart murmur    History of methicillin resistant staphylococcus aureus (MRSA) 2000   Hypertension     Past Surgical History:  Procedure Laterality Date   CATARACT EXTRACTION W/PHACO Right 03/23/2017   Procedure: CATARACT EXTRACTION PHACO AND INTRAOCULAR LENS PLACEMENT (IOC);  Surgeon: Galen Manila, MD;  Location: [facility];  Service: Ophthalmology;  Laterality: Right;  [place] 00:36.7 AP% 13.2 CDE 4.84 Fluid Pack lot # [id number]   DILATION AND CURETTAGE OF UTERUS     SHOULER Left    rotator cuff   THORACOTOMY     WEDGE RESECTION   TOTAL KNEE ARTHROPLASTY Left 05/09/2019   Procedure: LEFT TOTAL KNEE ARTHROPLASTY;  Surgeon: Kennedy Bucker, MD;  Location: [facility];  Service: Orthopedics;  Laterality: Left;   TOTAL KNEE REVISION Left 12/02/2021   Procedure: TOTAL KNEE REVISION;  Surgeon: Lyndle Herrlich, MD;  Location: [facility];  Service: Orthopedics;  Laterality: Left;    Social History   Socioeconomic History   Marital status: Divorced    Spouse name: Not on file   Number of children: Not on file   Years of education: Not on file   Highest education level: Not on file  Occupational History   Not on file  Tobacco Use   Smoking  status: Former   Smokeless tobacco: Never   Tobacco comments:    45 years ago  Vaping Use   Vaping status: Never Used  Substance and Sexual Activity   Alcohol use: Yes    Alcohol/week: 14.0 standard drinks of alcohol    Types: 14 Cans of beer per week   Drug use: Never   Sexual activity: Not on file  Other Topics Concern   Not on file  Social History Narrative   Lives alone   Social Determinants of Health   Financial Resource Strain: Not on file  Food Insecurity: Not on file  Transportation Needs: Not on file  Physical Activity: Not on file  Stress: Not on file  Social Connections: Not on file  Intimate Partner Violence: Not on file    Family History  Problem Relation Age of Onset   Breast cancer Mother 38    Allergies  Allergen Reactions   Paroxetine Hcl Itching   Simbrinza [Brinzolamide-Brimonidine] Other (See Comments)    Dry mouth, itching eyes, "eczema eyes" - tolerates with [place] eye drops   Benadryl [Diphenhydramine]     Glaucoma (caused optic nerve damage)   Chlorhexidine Gluconate    Crab [Shellfish Allergy] Swelling    And itching   Amlodipine Rash   Benzonatate Rash   Chlorhexidine Rash  Erythromycin Other (See Comments)    Stomach issues   Nickel Rash    Outpatient Medications Prior to Visit  Medication Sig   albuterol (PROVENTIL HFA;VENTOLIN HFA) 108 (90 Base) MCG/ACT inhaler Inhale 1 puff every 6 (six) hours as needed into the lungs for wheezing or shortness of breath.   Calcium Carb-Cholecalciferol (CALCIUM 600+D) 600-800 MG-UNIT TABS Take 1 tablet by mouth daily at 12 noon.    cetirizine (ZYRTEC) 10 MG tablet Take 10 mg at bedtime by mouth.   fluticasone (FLONASE) 50 MCG/ACT nasal spray USE 2 SPRAYS IN EACH NOSTRIL EVERY DAY   folic acid (FOLVITE) 1 MG tablet Take 1 mg by mouth daily.   furosemide (LASIX) 20 MG tablet Take 20 mg by mouth daily.    methotrexate (RHEUMATREX) 10 MG tablet Take 60 mg by mouth once a week. Caution: Chemotherapy.  Protect from light.   montelukast (SINGULAIR) 10 MG tablet Take 10 mg daily by mouth.   nystatin (MYCOSTATIN/NYSTOP) powder APPLY TO AFFECTED AREA TWICE A DAY   Omega-3 Fatty Acids (FISH OIL) 1000 MG CAPS Take 3,000 mg by mouth daily at 12 noon.    raloxifene (EVISTA) 60 MG tablet Take 60 mg by mouth daily at 12 noon.    simvastatin (ZOCOR) 10 MG tablet TAKE 1 TABLET BY MOUTH EVERY DAY   valsartan (DIOVAN) 80 MG tablet Take 80 mg by mouth daily.   Vibegron (GEMTESA) 75 MG TABS Take 1 tablet (75 mg total) by mouth daily.   aspirin 81 MG chewable tablet Chew 1 tablet (81 mg total) by mouth 2 (two) times daily. (Patient not taking: Reported on 01/19/2023)   clotrimazole-betamethasone (LOTRISONE) cream Apply 1 application topically 2 (two) times daily as needed (eczema). (Patient not taking: Reported on 04/05/2023)   latanoprost (XALATAN) 0.005 % ophthalmic solution Place 1 drop into both eyes daily. [place] Abbot (Patient not taking: Reported on 01/19/2023)   Propylene Glycol (SYSTANE BALANCE) 0.6 % SOLN Place 1 drop into both eyes 3 (three) times daily as needed (dry/irritated eyes.). (Patient not taking: Reported on 01/19/2023)   triamcinolone cream (KENALOG) 0.1 % Apply 1 application topically 2 (two) times daily as needed (eczema). (Patient not taking: Reported on 07/06/2022)   No facility-administered medications prior to visit.    Review of Systems  Constitutional: Negative.  Negative for chills, diaphoresis, fever, malaise/fatigue and weight loss.  HENT: Negative.  Negative for congestion, hearing loss, nosebleeds and sinus pain.   Eyes: Negative.   Respiratory: Negative.  Negative for cough, shortness of breath and stridor.   Cardiovascular: Negative.  Negative for chest pain, palpitations and leg swelling.  Gastrointestinal: Negative.  Negative for abdominal pain, constipation, diarrhea, heartburn, nausea and vomiting.  Genitourinary: Negative.  Negative for dysuria and flank pain.   Musculoskeletal: Negative.  Negative for joint pain and myalgias.  Skin: Negative.   Neurological: Negative.  Negative for dizziness, tingling, tremors, speech change, focal weakness, seizures and headaches.  Endo/Heme/Allergies: Negative.   Psychiatric/Behavioral: Negative.  Negative for depression and suicidal ideas. The patient is not nervous/anxious.        Objective:   BP (!) 140/78   Pulse 79   Ht 5' 6.5" (1.689 m)   Wt 168 lb (76.2 kg)   SpO2 98%   BMI 26.71 kg/m   Vitals:   04/05/23 1035  BP: (!) 140/78  Pulse: 79  Height: 5' 6.5" (1.689 m)  Weight: 168 lb (76.2 kg)  SpO2: 98%  BMI (Calculated): 26.71    Physical  Exam Vitals and nursing note reviewed.  Constitutional:      Appearance: Normal appearance.  HENT:     Head: Normocephalic and atraumatic.     Nose: Nose normal.     Mouth/Throat:     Mouth: Mucous membranes are moist.     Pharynx: Oropharynx is clear.  Eyes:     Conjunctiva/sclera: Conjunctivae normal.     Pupils: Pupils are equal, round, and reactive to light.  Cardiovascular:     Rate and Rhythm: Normal rate and regular rhythm.     Pulses: Normal pulses.     Heart sounds: Normal heart sounds. No murmur heard. Pulmonary:     Effort: Pulmonary effort is normal.     Breath sounds: Normal breath sounds. No wheezing.  Abdominal:     General: Bowel sounds are normal.     Palpations: Abdomen is soft.     Tenderness: There is no abdominal tenderness. There is no right CVA tenderness or left CVA tenderness.  Musculoskeletal:        General: Normal range of motion.     Cervical back: Normal range of motion.     Right lower leg: No edema.     Left lower leg: No edema.  Skin:    General: Skin is warm and dry.  Neurological:     General: No focal deficit present.     Mental Status: She is alert and oriented to person, place, and time.  Psychiatric:        Mood and Affect: Mood normal.        Behavior: Behavior normal.      No results  found for any visits on 04/05/23.  Recent Results (from the past 2160 hour(s))  CBC     Status: Abnormal   Collection Time: 01/17/23 10:04 AM  Result Value Ref Range   WBC 7.6 4.0 - 10.5 K/uL   RBC 5.09 3.87 - 5.11 MIL/uL   Hemoglobin 15.6 (H) 12.0 - 15.0 g/dL   HCT 91.4 (H) 78.2 - 95.6 %   MCV 93.7 80.0 - 100.0 fL   MCH 30.6 26.0 - 34.0 pg   MCHC 32.7 30.0 - 36.0 g/dL   RDW 21.3 08.6 - 57.8 %   Platelets 305 150 - 400 K/uL   nRBC 0.0 0.0 - 0.2 %    Comment: Performed at [facility], [address] [place], [place] [address]  Differential     Status: None   Collection Time: 01/17/23 10:04 AM  Result Value Ref Range   Neutrophils Relative % 65 %   Neutro Abs 5.0 1.7 - 7.7 K/uL   Lymphocytes Relative 24 %   Lymphs Abs 1.8 0.7 - 4.0 K/uL   Monocytes Relative 8 %   Monocytes Absolute 0.6 0.1 - 1.0 K/uL   Eosinophils Relative 1 %   Eosinophils Absolute 0.1 0.0 - 0.5 K/uL   Basophils Relative 1 %   Basophils Absolute 0.1 0.0 - 0.1 K/uL   Immature Granulocytes 1 %   Abs Immature Granulocytes 0.04 0.00 - 0.07 K/uL    Comment: Performed at [facility], [address] [place], [place] [address]  Comprehensive metabolic panel     Status: Abnormal   Collection Time: 01/17/23 10:04 AM  Result Value Ref Range   Sodium 142 135 - 145 mmol/L   Potassium 3.7 3.5 - 5.1 mmol/L   Chloride 104 98 - 111 mmol/L   CO2 27 22 - 32 mmol/L   Glucose, Bld  108 (H) 70 - 99 mg/dL    Comment: Glucose reference range applies only to samples taken after fasting for at least 8 hours.   BUN 13 8 - 23 mg/dL   Creatinine, Ser 1.61 0.44 - 1.00 mg/dL   Calcium 9.1 8.9 - 09.6 mg/dL   Total Protein 7.2 6.5 - 8.1 g/dL   Albumin 4.3 3.5 - 5.0 g/dL   AST 24 15 - 41 U/L   ALT 24 0 - 44 U/L   Alkaline Phosphatase 65 38 - 126 U/L   Total Bilirubin 0.7 0.3 - 1.2 mg/dL   GFR, Estimated >04 >54 mL/min    Comment: (NOTE) Calculated using the CKD-EPI Creatinine Equation (2021)    Anion gap 11  5 - 15    Comment: Performed at [facility], [address] [place], [place] [address]      Assessment & Plan:  Continue medications.  Schedule mammogram.  Proceed with colonoscopy. Patient will return for annual wellness visit.  Gladis was seen today for follow-up.  Diagnoses and all orders for this visit:  Breast cancer screening by mammogram -     MM 3D SCREENING MAMMOGRAM BILATERAL BREAST; Future  Psoriatic arthritis (HCC) -     CBC with Diff  Prediabetes -     Hemoglobin A1c  Mixed hyperlipidemia -     Lipid Panel w/o Chol/HDL Ratio     Total time spent: 30 minutes  Margaretann Loveless, MD  04/05/2023   This document may have been prepared by Dragon Voice Recognition software and as such may include unintentional dictation errors.

## 2023-04-11 ENCOUNTER — Other Ambulatory Visit: Payer: Self-pay | Admitting: Internal Medicine

## 2023-05-17 ENCOUNTER — Other Ambulatory Visit: Payer: Self-pay | Admitting: Internal Medicine

## 2023-05-19 ENCOUNTER — Other Ambulatory Visit: Payer: Self-pay | Admitting: Internal Medicine

## 2023-05-19 DIAGNOSIS — I10 Essential (primary) hypertension: Secondary | ICD-10-CM

## 2023-06-23 ENCOUNTER — Other Ambulatory Visit: Payer: Medicare PPO

## 2023-06-23 DIAGNOSIS — R7303 Prediabetes: Secondary | ICD-10-CM | POA: Diagnosis not present

## 2023-06-23 DIAGNOSIS — L4050 Arthropathic psoriasis, unspecified: Secondary | ICD-10-CM | POA: Diagnosis not present

## 2023-06-23 DIAGNOSIS — E782 Mixed hyperlipidemia: Secondary | ICD-10-CM | POA: Diagnosis not present

## 2023-06-24 LAB — CBC WITH DIFFERENTIAL/PLATELET
Basophils Absolute: 0.1 x10E3/uL (ref 0.0–0.2)
Basos: 1 %
EOS (ABSOLUTE): 0.1 x10E3/uL (ref 0.0–0.4)
Eos: 2 %
Hematocrit: 49.9 % — ABNORMAL HIGH (ref 34.0–46.6)
Hemoglobin: 16.3 g/dL — ABNORMAL HIGH (ref 11.1–15.9)
Immature Grans (Abs): 0 x10E3/uL (ref 0.0–0.1)
Immature Granulocytes: 0 %
Lymphocytes Absolute: 2 x10E3/uL (ref 0.7–3.1)
Lymphs: 27 %
MCH: 30.3 pg (ref 26.6–33.0)
MCHC: 32.7 g/dL (ref 31.5–35.7)
MCV: 93 fL (ref 79–97)
Monocytes Absolute: 0.6 x10E3/uL (ref 0.1–0.9)
Monocytes: 8 %
Neutrophils Absolute: 4.8 x10E3/uL (ref 1.4–7.0)
Neutrophils: 62 %
Platelets: 267 x10E3/uL (ref 150–450)
RBC: 5.38 x10E6/uL — ABNORMAL HIGH (ref 3.77–5.28)
RDW: 14 % (ref 11.7–15.4)
WBC: 7.5 x10E3/uL (ref 3.4–10.8)

## 2023-06-24 LAB — LIPID PANEL W/O CHOL/HDL RATIO
Cholesterol, Total: 159 mg/dL (ref 100–199)
HDL: 45 mg/dL (ref >39)
LDL Chol Calc (NIH): 89 mg/dL (ref 0–99)
Triglycerides: 144 mg/dL (ref 0–149)
VLDL Cholesterol Cal: 25 mg/dL (ref 5–40)

## 2023-06-24 LAB — HEMOGLOBIN A1C
Est. average glucose Bld gHb Est-mCnc: 140 mg/dL
Hgb A1c MFr Bld: 6.5 % — ABNORMAL HIGH (ref 4.8–5.6)

## 2023-07-05 ENCOUNTER — Ambulatory Visit: Payer: Medicare PPO | Admitting: Internal Medicine

## 2023-07-05 ENCOUNTER — Encounter: Payer: Self-pay | Admitting: Internal Medicine

## 2023-07-05 VITALS — BP 132/66 | HR 86 | Ht 66.5 in | Wt 166.4 lb

## 2023-07-05 DIAGNOSIS — I10 Essential (primary) hypertension: Secondary | ICD-10-CM | POA: Diagnosis not present

## 2023-07-05 DIAGNOSIS — R221 Localized swelling, mass and lump, neck: Secondary | ICD-10-CM

## 2023-07-05 DIAGNOSIS — R7303 Prediabetes: Secondary | ICD-10-CM

## 2023-07-05 DIAGNOSIS — E782 Mixed hyperlipidemia: Secondary | ICD-10-CM

## 2023-07-05 NOTE — Progress Notes (Signed)
 Established Patient Office Visit  Subjective:  Patient ID: Jasmine Sullivan, female    DOB: 20-Apr-1946  Age: 78 y.o. MRN: [id number]  Chief Complaint  Patient presents with   Follow-up    3 month follow up    Patient is here for her follow-up today.  She is generally feeling well as of chest pain, no shortness of breath, no palpitations and no fatigue.  However she mentions that she felt a palpable nodule on the right submandibular area especially when she turns her head to the left.  She is not having any night sweats or fever, white blood cell count is normal.  However her H&H is higher than before.  Patient does not smoke and there is no risk of carbon monoxide leak in her house.  Will repeated today.  Also schedule a CT neck for suspected lymphadenopathy.    No other concerns at this time.   Past Medical History:  Diagnosis Date   Arthritis    Asthma    Complication of anesthesia    Benadryl [Diphenhydramine] recieved after surgery and was difficult to wake up   COPD (chronic obstructive pulmonary disease) (HCC)    Dyspnea    DOE   Fatty liver    Glaucoma    Heart murmur    History of methicillin resistant staphylococcus aureus (MRSA) 2000   Hypertension     Past Surgical History:  Procedure Laterality Date   CATARACT EXTRACTION W/PHACO Right 03/23/2017   Procedure: CATARACT EXTRACTION PHACO AND INTRAOCULAR LENS PLACEMENT (IOC);  Surgeon: Galen Manila, MD;  Location: [facility] ORS;  Service: Ophthalmology;  Laterality: Right;  [place] 00:36.7 AP% 13.2 CDE 4.84 Fluid Pack lot # [id number]   DILATION AND CURETTAGE OF UTERUS     SHOULER Left    rotator cuff   THORACOTOMY     WEDGE RESECTION   TOTAL KNEE ARTHROPLASTY Left 05/09/2019   Procedure: LEFT TOTAL KNEE ARTHROPLASTY;  Surgeon: Kennedy Bucker, MD;  Location: [facility];  Service: Orthopedics;  Laterality: Left;   TOTAL KNEE REVISION Left 12/02/2021   Procedure: TOTAL KNEE REVISION;  Surgeon: Lyndle Herrlich, MD;   Location: [facility];  Service: Orthopedics;  Laterality: Left;    Social History   Socioeconomic History   Marital status: Divorced    Spouse name: Not on file   Number of children: Not on file   Years of education: Not on file   Highest education level: Not on file  Occupational History   Not on file  Tobacco Use   Smoking status: Former   Smokeless tobacco: Never   Tobacco comments:    45 years ago  Vaping Use   Vaping status: Never Used  Substance and Sexual Activity   Alcohol use: Yes    Alcohol/week: 14.0 standard drinks of alcohol    Types: 14 Cans of beer per week   Drug use: Never   Sexual activity: Not on file  Other Topics Concern   Not on file  Social History Narrative   Lives alone   Social Drivers of Health   Financial Resource Strain: Not on file  Food Insecurity: Not on file  Transportation Needs: Not on file  Physical Activity: Not on file  Stress: Not on file  Social Connections: Not on file  Intimate Partner Violence: Not on file    Family History  Problem Relation Age of Onset   Breast cancer Mother 27    Allergies  Allergen Reactions   Paroxetine Hcl  Itching   Simbrinza [Brinzolamide-Brimonidine] Other (See Comments)    Dry mouth, itching eyes, "eczema eyes" - tolerates with [place] eye drops   Benadryl [Diphenhydramine]     Glaucoma (caused optic nerve damage)   Chlorhexidine Gluconate    Crab [Shellfish Allergy] Swelling    And itching   Amlodipine Rash   Benzonatate Rash   Chlorhexidine Rash   Erythromycin Other (See Comments)    Stomach issues   Nickel Rash    Outpatient Medications Prior to Visit  Medication Sig   albuterol (PROVENTIL HFA;VENTOLIN HFA) 108 (90 Base) MCG/ACT inhaler Inhale 1 puff every 6 (six) hours as needed into the lungs for wheezing or shortness of breath.   Calcium Carb-Cholecalciferol (CALCIUM 600+D) 600-800 MG-UNIT TABS Take 1 tablet by mouth daily at 12 noon.    cetirizine (ZYRTEC) 10 MG tablet Take 10  mg at bedtime by mouth.   folic acid (FOLVITE) 1 MG tablet Take 1 mg by mouth daily.   furosemide (LASIX) 20 MG tablet TAKE 1 TABLET BY MOUTH EVERY DAY   methotrexate (RHEUMATREX) 10 MG tablet Take 60 mg by mouth once a week. Caution: Chemotherapy. Protect from light.   montelukast (SINGULAIR) 10 MG tablet TAKE 1 TABLET BY MOUTH EVERY DAY   nystatin (MYCOSTATIN/NYSTOP) powder APPLY TO AFFECTED AREA TWICE A DAY   Omega-3 Fatty Acids (FISH OIL) 1000 MG CAPS Take 3,000 mg by mouth daily at 12 noon.    raloxifene (EVISTA) 60 MG tablet Take 60 mg by mouth daily at 12 noon.    simvastatin (ZOCOR) 10 MG tablet TAKE 1 TABLET BY MOUTH EVERY DAY   valsartan (DIOVAN) 80 MG tablet TAKE 1 TABLET BY MOUTH EVERY DAY   Vibegron (GEMTESA) 75 MG TABS Take 1 tablet (75 mg total) by mouth daily.   aspirin 81 MG chewable tablet Chew 1 tablet (81 mg total) by mouth 2 (two) times daily. (Patient not taking: Reported on 07/05/2023)   fluticasone (FLONASE) 50 MCG/ACT nasal spray USE 2 SPRAYS IN EACH NOSTRIL EVERY DAY (Patient not taking: Reported on 07/05/2023)   [DISCONTINUED] clotrimazole-betamethasone (LOTRISONE) cream Apply 1 application topically 2 (two) times daily as needed (eczema). (Patient not taking: Reported on 04/05/2023)   [DISCONTINUED] latanoprost (XALATAN) 0.005 % ophthalmic solution Place 1 drop into both eyes daily. [place] Abbot (Patient not taking: Reported on 01/19/2023)   [DISCONTINUED] Propylene Glycol (SYSTANE BALANCE) 0.6 % SOLN Place 1 drop into both eyes 3 (three) times daily as needed (dry/irritated eyes.). (Patient not taking: Reported on 01/19/2023)   [DISCONTINUED] triamcinolone cream (KENALOG) 0.1 % Apply 1 application topically 2 (two) times daily as needed (eczema). (Patient not taking: Reported on 07/06/2022)   No facility-administered medications prior to visit.    Review of Systems  Constitutional: Negative.  Negative for chills, fever, malaise/fatigue and weight loss.  HENT: Negative.     Eyes: Negative.   Respiratory: Negative.  Negative for cough and shortness of breath.   Cardiovascular: Negative.  Negative for chest pain, palpitations and leg swelling.  Gastrointestinal: Negative.  Negative for abdominal pain, constipation, diarrhea, heartburn, nausea and vomiting.  Genitourinary: Negative.  Negative for dysuria and flank pain.  Musculoskeletal: Negative.  Negative for joint pain and myalgias.  Skin: Negative.   Neurological: Negative.  Negative for dizziness and headaches.  Endo/Heme/Allergies: Negative.   Psychiatric/Behavioral: Negative.  Negative for depression and suicidal ideas. The patient is not nervous/anxious.        Objective:   BP 132/66   Pulse 86  Ht 5' 6.5" (1.689 m)   Wt 166 lb 6.4 oz (75.5 kg)   SpO2 97%   BMI 26.46 kg/m   Vitals:   07/05/23 1014  BP: 132/66  Pulse: 86  Height: 5' 6.5" (1.689 m)  Weight: 166 lb 6.4 oz (75.5 kg)  SpO2: 97%  BMI (Calculated): 26.46    Physical Exam Vitals and nursing note reviewed.  Constitutional:      Appearance: Normal appearance.  HENT:     Head: Normocephalic and atraumatic.     Nose: Nose normal.     Mouth/Throat:     Mouth: Mucous membranes are moist.     Pharynx: Oropharynx is clear.  Eyes:     Conjunctiva/sclera: Conjunctivae normal.     Pupils: Pupils are equal, round, and reactive to light.  Cardiovascular:     Rate and Rhythm: Normal rate and regular rhythm.     Pulses: Normal pulses.     Heart sounds: Normal heart sounds. No murmur heard. Pulmonary:     Effort: Pulmonary effort is normal.     Breath sounds: Normal breath sounds. No wheezing.  Abdominal:     General: Bowel sounds are normal.     Palpations: Abdomen is soft.     Tenderness: There is no abdominal tenderness. There is no right CVA tenderness or left CVA tenderness.  Musculoskeletal:        General: Normal range of motion.     Cervical back: Normal range of motion.     Right lower leg: No edema.     Left lower  leg: No edema.  Skin:    General: Skin is warm and dry.  Neurological:     General: No focal deficit present.     Mental Status: She is alert and oriented to person, place, and time.  Psychiatric:        Mood and Affect: Mood normal.        Behavior: Behavior normal.      No results found for any visits on 07/05/23.  Recent Results (from the past 2160 hours)  Lipid Panel w/o Chol/HDL Ratio     Status: None   Collection Time: 06/23/23  9:04 AM  Result Value Ref Range   Cholesterol, Total 159 100 - 199 mg/dL   Triglycerides 130 0 - 149 mg/dL   HDL 45 >86 mg/dL   VLDL Cholesterol Cal 25 5 - 40 mg/dL   LDL Chol Calc (NIH) 89 0 - 99 mg/dL  Hemoglobin V7Q     Status: Abnormal   Collection Time: 06/23/23  9:04 AM  Result Value Ref Range   Hgb A1c MFr Bld 6.5 (H) 4.8 - 5.6 %    Comment:          Prediabetes: 5.7 - 6.4          Diabetes: >6.4          Glycemic control for adults with diabetes: <7.0    Est. average glucose Bld gHb Est-mCnc 140 mg/dL  CBC with Diff     Status: Abnormal   Collection Time: 06/23/23  9:04 AM  Result Value Ref Range   WBC 7.5 3.4 - 10.8 x10E3/uL   RBC 5.38 (H) 3.77 - 5.28 x10E6/uL   Hemoglobin 16.3 (H) 11.1 - 15.9 g/dL   Hematocrit 46.9 (H) 62.9 - 46.6 %   MCV 93 79 - 97 fL   MCH 30.3 26.6 - 33.0 pg   MCHC 32.7 31.5 - 35.7 g/dL   RDW  14.0 11.7 - 15.4 %   Platelets 267 150 - 450 x10E3/uL   Neutrophils 62 Not Estab. %   Lymphs 27 Not Estab. %   Monocytes 8 Not Estab. %   Eos 2 Not Estab. %   Basos 1 Not Estab. %   Neutrophils Absolute 4.8 1.4 - 7.0 x10E3/uL   Lymphocytes Absolute 2.0 0.7 - 3.1 x10E3/uL   Monocytes Absolute 0.6 0.1 - 0.9 x10E3/uL   EOS (ABSOLUTE) 0.1 0.0 - 0.4 x10E3/uL   Basophils Absolute 0.1 0.0 - 0.2 x10E3/uL   Immature Granulocytes 0 Not Estab. %   Immature Grans (Abs) 0.0 0.0 - 0.1 x10E3/uL      Assessment & Plan:  CT neck.  Repeat CBC and CMP. Problem List Items Addressed This Visit     Hyperlipidemia   Essential  hypertension, benign - Primary   Relevant Orders   CMP14+EGFR   Other Visit Diagnoses       Neck mass       Relevant Orders   CT SOFT TISSUE NECK W WO CONTRAST   CBC with Diff     Prediabetes           Follow up in ten days to discuss results.  Total time spent: 30 minutes  Margaretann Loveless, MD  07/05/2023   This document may have been prepared by [employer] Voice Recognition software and as such may include unintentional dictation errors.

## 2023-07-06 LAB — CBC WITH DIFFERENTIAL/PLATELET
Basophils Absolute: 0 10*3/uL (ref 0.0–0.2)
Basos: 1 %
EOS (ABSOLUTE): 0.1 10*3/uL (ref 0.0–0.4)
Eos: 1 %
Hematocrit: 46.1 % (ref 34.0–46.6)
Hemoglobin: 15.1 g/dL (ref 11.1–15.9)
Immature Grans (Abs): 0 10*3/uL (ref 0.0–0.1)
Immature Granulocytes: 0 %
Lymphocytes Absolute: 2 10*3/uL (ref 0.7–3.1)
Lymphs: 26 %
MCH: 30.8 pg (ref 26.6–33.0)
MCHC: 32.8 g/dL (ref 31.5–35.7)
MCV: 94 fL (ref 79–97)
Monocytes Absolute: 0.5 10*3/uL (ref 0.1–0.9)
Monocytes: 7 %
Neutrophils Absolute: 4.8 10*3/uL (ref 1.4–7.0)
Neutrophils: 65 %
Platelets: 248 10*3/uL (ref 150–450)
RBC: 4.9 x10E6/uL (ref 3.77–5.28)
RDW: 13.9 % (ref 11.7–15.4)
WBC: 7.5 10*3/uL (ref 3.4–10.8)

## 2023-07-06 LAB — CMP14+EGFR
ALT: 26 IU/L (ref 0–32)
AST: 24 IU/L (ref 0–40)
Albumin: 4.3 g/dL (ref 3.8–4.8)
Alkaline Phosphatase: 78 IU/L (ref 44–121)
BUN/Creatinine Ratio: 16 (ref 12–28)
BUN: 12 mg/dL (ref 8–27)
Bilirubin Total: 0.4 mg/dL (ref 0.0–1.2)
CO2: 23 mmol/L (ref 20–29)
Calcium: 9.3 mg/dL (ref 8.7–10.3)
Chloride: 104 mmol/L (ref 96–106)
Creatinine, Ser: 0.76 mg/dL (ref 0.57–1.00)
Globulin, Total: 2.2 g/dL (ref 1.5–4.5)
Glucose: 129 mg/dL — ABNORMAL HIGH (ref 70–99)
Potassium: 4.1 mmol/L (ref 3.5–5.2)
Sodium: 144 mmol/L (ref 134–144)
Total Protein: 6.5 g/dL (ref 6.0–8.5)
eGFR: 81 mL/min/{1.73_m2} (ref >59)

## 2023-07-08 NOTE — Progress Notes (Signed)
 Patient notified

## 2023-07-13 ENCOUNTER — Ambulatory Visit

## 2023-07-13 DIAGNOSIS — R221 Localized swelling, mass and lump, neck: Secondary | ICD-10-CM | POA: Diagnosis not present

## 2023-07-13 MED ORDER — IOHEXOL 300 MG/ML  SOLN
100.0000 mL | Freq: Once | INTRAMUSCULAR | Status: AC | PRN
Start: 2023-07-13 — End: 2023-07-13
  Administered 2023-07-13: 100 mL via INTRAVENOUS

## 2023-07-21 ENCOUNTER — Ambulatory Visit
Admission: RE | Admit: 2023-07-21 | Discharge: 2023-07-21 | Disposition: A | Discharge status: Home / Self Care | Source: Ambulatory Visit | Attending: Internal Medicine | Admitting: Internal Medicine

## 2023-07-21 DIAGNOSIS — Z1231 Encounter for screening mammogram for malignant neoplasm of breast: Secondary | ICD-10-CM | POA: Diagnosis not present

## 2023-07-21 NOTE — Progress Notes (Signed)
 Patient notified

## 2023-07-26 ENCOUNTER — Ambulatory Visit: Admitting: Internal Medicine

## 2023-08-04 DIAGNOSIS — Z961 Presence of intraocular lens: Secondary | ICD-10-CM | POA: Diagnosis not present

## 2023-08-04 DIAGNOSIS — H269 Unspecified cataract: Secondary | ICD-10-CM | POA: Diagnosis not present

## 2023-08-04 DIAGNOSIS — Z0100 Encounter for examination of eyes and vision without abnormal findings: Secondary | ICD-10-CM | POA: Diagnosis not present

## 2023-08-15 ENCOUNTER — Other Ambulatory Visit: Payer: Self-pay | Admitting: Internal Medicine

## 2023-08-15 DIAGNOSIS — N3281 Overactive bladder: Secondary | ICD-10-CM

## 2023-08-31 DIAGNOSIS — L4050 Arthropathic psoriasis, unspecified: Secondary | ICD-10-CM | POA: Diagnosis not present

## 2023-08-31 DIAGNOSIS — M25541 Pain in joints of right hand: Secondary | ICD-10-CM | POA: Diagnosis not present

## 2023-08-31 DIAGNOSIS — L409 Psoriasis, unspecified: Secondary | ICD-10-CM | POA: Diagnosis not present

## 2023-08-31 DIAGNOSIS — R768 Other specified abnormal immunological findings in serum: Secondary | ICD-10-CM | POA: Diagnosis not present

## 2023-08-31 DIAGNOSIS — Z7960 Long term (current) use of unspecified immunomodulators and immunosuppressants: Secondary | ICD-10-CM | POA: Diagnosis not present

## 2023-08-31 DIAGNOSIS — M150 Primary generalized (osteo)arthritis: Secondary | ICD-10-CM | POA: Diagnosis not present

## 2023-12-06 ENCOUNTER — Other Ambulatory Visit: Payer: Self-pay | Admitting: Internal Medicine

## 2023-12-08 DIAGNOSIS — H269 Unspecified cataract: Secondary | ICD-10-CM | POA: Diagnosis not present

## 2023-12-08 DIAGNOSIS — H401232 Low-tension glaucoma, bilateral, moderate stage: Secondary | ICD-10-CM | POA: Diagnosis not present

## 2023-12-08 DIAGNOSIS — H5005 Alternating esotropia: Secondary | ICD-10-CM | POA: Diagnosis not present

## 2023-12-08 DIAGNOSIS — Z0100 Encounter for examination of eyes and vision without abnormal findings: Secondary | ICD-10-CM | POA: Diagnosis not present

## 2023-12-15 DIAGNOSIS — R195 Other fecal abnormalities: Secondary | ICD-10-CM | POA: Diagnosis not present

## 2023-12-15 DIAGNOSIS — Z1211 Encounter for screening for malignant neoplasm of colon: Secondary | ICD-10-CM | POA: Diagnosis not present

## 2023-12-21 ENCOUNTER — Ambulatory Visit: Admitting: Anesthesiology

## 2023-12-21 ENCOUNTER — Encounter: Payer: Self-pay | Admitting: Gastroenterology

## 2023-12-21 ENCOUNTER — Encounter
Admission: RE | Disposition: A | Discharge status: Home / Self Care | Payer: Self-pay | Source: Home / Self Care | Attending: Gastroenterology

## 2023-12-21 ENCOUNTER — Ambulatory Visit
Admission: RE | Admit: 2023-12-21 | Discharge: 2023-12-21 | Disposition: A | Discharge status: Home / Self Care | Attending: Gastroenterology | Admitting: Gastroenterology

## 2023-12-21 DIAGNOSIS — I10 Essential (primary) hypertension: Secondary | ICD-10-CM | POA: Diagnosis not present

## 2023-12-21 DIAGNOSIS — Z1211 Encounter for screening for malignant neoplasm of colon: Secondary | ICD-10-CM | POA: Diagnosis not present

## 2023-12-21 DIAGNOSIS — K760 Fatty (change of) liver, not elsewhere classified: Secondary | ICD-10-CM | POA: Insufficient documentation

## 2023-12-21 DIAGNOSIS — Z87891 Personal history of nicotine dependence: Secondary | ICD-10-CM | POA: Diagnosis not present

## 2023-12-21 DIAGNOSIS — J4489 Other specified chronic obstructive pulmonary disease: Secondary | ICD-10-CM | POA: Diagnosis not present

## 2023-12-21 DIAGNOSIS — Z538 Procedure and treatment not carried out for other reasons: Secondary | ICD-10-CM | POA: Diagnosis not present

## 2023-12-21 DIAGNOSIS — K5730 Diverticulosis of large intestine without perforation or abscess without bleeding: Secondary | ICD-10-CM | POA: Diagnosis not present

## 2023-12-21 HISTORY — PX: COLONOSCOPY: SHX5424

## 2023-12-21 SURGERY — COLONOSCOPY
Anesthesia: General

## 2023-12-21 MED ORDER — PROPOFOL 500 MG/50ML IV EMUL
INTRAVENOUS | Status: DC | PRN
  Administered 2023-12-21: 155 ug/kg/min via INTRAVENOUS

## 2023-12-21 MED ORDER — LIDOCAINE HCL (CARDIAC) PF 100 MG/5ML IV SOSY
PREFILLED_SYRINGE | INTRAVENOUS | Status: DC | PRN
  Administered 2023-12-21: 100 mg via INTRAVENOUS

## 2023-12-21 MED ORDER — SODIUM CHLORIDE 0.9 % IV SOLN: INTRAVENOUS | Status: DC

## 2023-12-21 MED ORDER — LABETALOL HCL 5 MG/ML IV SOLN
INTRAVENOUS | Status: DC | PRN
Start: 2023-12-21 — End: 2023-12-21
  Administered 2023-12-21: 10 mg via INTRAVENOUS

## 2023-12-21 MED ORDER — PROPOFOL 10 MG/ML IV BOLUS
INTRAVENOUS | Status: DC | PRN
  Administered 2023-12-21: 50 mg via INTRAVENOUS

## 2023-12-21 NOTE — Discharge Instructions (Signed)
 Please track blood pressures daily and follow-up with PCP to access for adequate blood pressure management. You had severely elevated blood pressure during colonoscopy that puts you at risk for stroke.

## 2023-12-21 NOTE — H&P (Signed)
 Ruel Kung , MD [address], [address], [place], [place], [address] Phone: [phone] Fax: [phone]  Primary Care Physician:  Fernand Fredy RAMAN, MD   Pre-Procedure History & Physical: HPI:  Jasmine Sullivan is a 78 y.o. female is here for an colonoscopy.   Past Medical History:  Diagnosis Date   Arthritis    Asthma    Complication of anesthesia    Benadryl [Diphenhydramine] recieved after surgery and was difficult to wake up   COPD (chronic obstructive pulmonary disease) (HCC)    Dyspnea    DOE   Fatty liver    Glaucoma    Heart murmur    History of methicillin resistant staphylococcus aureus (MRSA) 2000   Hypertension     Past Surgical History:  Procedure Laterality Date   CATARACT EXTRACTION W/PHACO Right 03/23/2017   Procedure: CATARACT EXTRACTION PHACO AND INTRAOCULAR LENS PLACEMENT (IOC);  Surgeon: Jaye Fallow, MD;  Location: [facility];  Service: Ophthalmology;  Laterality: Right;  [place]  00:36.7 AP% 13.2 CDE 4.84 Fluid Pack lot # [id number]   DILATION AND CURETTAGE OF UTERUS     SHOULER Left    rotator cuff   THORACOTOMY     WEDGE RESECTION   TOTAL KNEE ARTHROPLASTY Left 05/09/2019   Procedure: LEFT TOTAL KNEE ARTHROPLASTY;  Surgeon: Kathlynn Sharper, MD;  Location: [facility];  Service: Orthopedics;  Laterality: Left;   TOTAL KNEE REVISION Left 12/02/2021   Procedure: TOTAL KNEE REVISION;  Surgeon: Leora Lynwood SAUNDERS, MD;  Location: [facility];  Service: Orthopedics;  Laterality: Left;    Prior to Admission medications   Medication Sig Start Date End Date Taking? Authorizing Provider  albuterol  (VENTOLIN  HFA) 108 (90 Base) MCG/ACT inhaler TAKE 2 PUFFS BY MOUTH 4 TIMES A DAY AS NEEDED 12/06/23   Scoggins, Hospital doctor, NP  aspirin  81 MG chewable tablet Chew 1 tablet (81 mg total) by mouth 2 (two) times daily. Patient not taking: Reported on 07/05/2023  12/04/21   Joshua Lin, PA-C  Calcium  Carb-Cholecalciferol  (CALCIUM  600+D) 600-800 MG-UNIT TABS Take 1 tablet by mouth daily at 12 noon.     [provider]  cetirizine  (ZYRTEC ) 10 MG tablet Take 10 mg at bedtime by mouth.    [provider]  fluticasone (FLONASE) 50 MCG/ACT nasal spray USE 2 SPRAYS IN EACH NOSTRIL EVERY DAY Patient not taking: Reported on 07/05/2023 10/05/22   Fernand Fredy RAMAN, MD  folic acid (FOLVITE) 1 MG tablet Take 1 mg by mouth daily.    [provider]  furosemide  (LASIX ) 20 MG tablet TAKE 1 TABLET BY MOUTH EVERY DAY 04/12/23   Fernand Fredy RAMAN, MD  GEMTESA  75 MG TABS TAKE 1 TABLET BY MOUTH EVERY DAY 08/15/23   Fernand Fredy RAMAN, MD  methotrexate (RHEUMATREX) 10 MG tablet Take 60 mg by mouth once a week. Caution: Chemotherapy. Protect from light.    [provider]  montelukast  (SINGULAIR ) 10 MG tablet TAKE 1 TABLET BY MOUTH EVERY DAY 04/12/23   Fernand Fredy RAMAN, MD  nystatin (MYCOSTATIN/NYSTOP) powder APPLY TO AFFECTED AREA TWICE A DAY 05/18/23   Fernand Fredy RAMAN, MD  Omega-3 Fatty Acids (FISH OIL) 1000 MG CAPS Take 3,000 mg by mouth daily at 12 noon.     [provider]  raloxifene  (EVISTA ) 60 MG tablet Take 60 mg by mouth daily at 12 noon.     [provider]  simvastatin  (ZOCOR ) 10 MG tablet TAKE 1 TABLET BY MOUTH EVERY DAY 03/22/23   Fernand Fredy  S, MD  valsartan (DIOVAN) 80 MG tablet TAKE 1 TABLET BY MOUTH EVERY DAY 05/20/23   Fernand Fredy RAMAN, MD    Allergies as of 12/16/2023 - Review Complete 07/05/2023  Allergen Reaction Noted   Paroxetine hcl Itching 05/17/2015   Simbrinza [brinzolamide-brimonidine] Other (See Comments) 03/12/2017   Benadryl [diphenhydramine]  05/01/2019   Chlorhexidine  gluconate  07/06/2022   Crab [shellfish allergy] Swelling 05/09/2019   Amlodipine Rash 05/17/2015   Benzonatate Rash 04/21/2014   Chlorhexidine  Rash 05/17/2015   Erythromycin Other (See Comments) 11/13/2013   Nickel Rash 12/02/2021     Family History  Problem Relation Age of Onset   Breast cancer Mother 22    Social History   Socioeconomic History   Marital status: Divorced    Spouse name: Not on file   Number of children: Not on file   Years of education: Not on file   Highest education level: Not on file  Occupational History   Not on file  Tobacco Use   Smoking status: Former   Smokeless tobacco: Never   Tobacco comments:    45 years ago  Vaping Use   Vaping status: Never Used  Substance and Sexual Activity   Alcohol  use: Yes    Alcohol /week: 14.0 standard drinks of alcohol     Types: 14 Cans of beer per week   Drug use: Never   Sexual activity: Not on file  Other Topics Concern   Not on file  Social History Narrative   Lives alone   Social Drivers of Health   Financial Resource Strain: Medium Risk (12/15/2023)   Received from [facility]   Overall Financial Resource Strain (CARDIA)    Difficulty of Paying Living Expenses: Somewhat hard  Food Insecurity: No Food Insecurity (12/15/2023)   Received from [facility]   Hunger Vital Sign    Within the past 12 months, you worried that your food would run out before you got the money to buy more.: Never true    Within the past 12 months, the food you bought just didn't last and you didn't have money to get more.: Never true  Transportation Needs: No Transportation Needs (12/15/2023)   Received from [facility] - Transportation    In the past 12 months, has lack of transportation kept you from medical appointments or from getting medications?: No    Lack of Transportation (Non-Medical): No  Physical Activity: Not on file  Stress: Not on file  Social Connections: Not on file  Intimate Partner Violence: Not on file    Review of Systems: See HPI, otherwise negative ROS  Physical Exam: There were no vitals taken for this visit. General:   Alert,  pleasant and cooperative in  NAD Head:  Normocephalic and atraumatic. Neck:  Supple; no masses or thyromegaly. Lungs:  Clear throughout to auscultation, normal respiratory effort.    Heart:  +S1, +S2, Regular rate and rhythm, No edema. Abdomen:  Soft, nontender and nondistended. Normal bowel sounds, without guarding, and without rebound.   Neurologic:  Alert and  oriented x4;  grossly normal neurologically.  Impression/Plan: Reia Raum Desautel is here for an colonoscopy to be performed for Screening colonoscopy average risk   Risks, benefits, limitations, and alternatives regarding  colonoscopy have been reviewed with the patient.  Questions have been answered.  All parties agreeable.   Ruel Kung, MD  12/21/2023, 10:58 AM

## 2023-12-21 NOTE — Anesthesia Procedure Notes (Signed)
 Procedure Name: General with mask airway Date/Time: 12/21/2023 11:42 AM  Performed by: Ledora Duncan, CRNAPre-anesthesia Checklist: Patient identified, Emergency Drugs available, Suction available and Patient being monitored Patient Re-evaluated:Patient Re-evaluated prior to induction Oxygen Delivery Method: Simple face mask Induction Type: IV induction Placement Confirmation: positive ETCO2 and breath sounds checked- equal and bilateral Dental Injury: Teeth and Oropharynx as per pre-operative assessment

## 2023-12-21 NOTE — Op Note (Signed)
 [facility] Patient Name: Jasmine Sullivan Procedure Date: 12/21/2023 11:38 AM MRN: [id number] Account #: [id number] Date of Birth: July 26, 1945 Admit Type: Outpatient Age: 78 Room: [facility] ENDO ROOM 2 Gender: Female Note Status: Finalized Instrument Name: Colon Scope [id number] Procedure:             Colonoscopy Indications:           Screening for colorectal malignant neoplasm Providers:             Ruel Kung MD, MD Referring MD:          Fredy CANDIE Bathe, MD (Referring MD) Medicines:             Monitored Anesthesia Care Complications:         No immediate complications. Procedure:             Pre-Anesthesia Assessment:                        - Prior to the procedure, a History and Physical was                         performed, and patient medications, allergies and                         sensitivities were reviewed. The patient's tolerance                         of previous anesthesia was reviewed.                        - The risks and benefits of the procedure and the                         sedation options and risks were discussed with the                         patient. All questions were answered and informed                         consent was obtained.                        - ASA Grade Assessment: II - A patient with mild                         systemic disease.                        After obtaining informed consent, the colonoscope was                         passed under direct vision. Throughout the procedure,                         the patient's blood pressure, pulse, and oxygen                         saturations were monitored continuously. The                         Colonoscope  was introduced through the anus and                         advanced to the the cecum, identified by the                         appendiceal orifice. The colonoscopy was technically                         difficult and complex due to the patient's                          cardiovascular instability (vasovagal reaction). The                         patient tolerated the procedure poorly due to the                         patient's cardiovascular instability (vasovagal                         reaction). The quality of the bowel preparation was                         good. The ileocecal valve, appendiceal orifice, and                         rectum were photographed. Findings:      The perianal and digital rectal examinations were normal.      Multiple medium-mouthed diverticula were found in the entire colon.      The exam was otherwise without abnormality.      when i reached the cecum and started withdrawing heart rate dropped to       low 20'2 with high bp, withdrew scope into transverse colon and heart       rate retrurned to 70's again when i pushed the scope back down had       further vagal reaction with dsrop in heart rate to the 50's and decided       to withjdraw the scope , rapid withdrawl and limited visualization .       When scope pulled out heart rate 70's . Would not recommend repeat       colonoscopy for screening purposes Impression:            - Diverticulosis in the entire examined colon.                        - The examination was otherwise normal.                        - No specimens collected. Recommendation:        - Discharge patient to home (with escort).                        - Resume previous diet.                        - Continue present medications.                        -  Repeat colonoscopy is not recommended due to current                         age (45 years or older) for screening purposes. Procedure Code(s):     --- Professional ---                        [id number], Colonoscopy, flexible; diagnostic, including                         collection of specimen(s) by brushing or washing, when                         performed (separate procedure) Diagnosis Code(s):     --- Professional ---                         K57.30, Diverticulosis of large intestine without                         perforation or abscess without bleeding                        Z12.11, Encounter for screening for malignant neoplasm                         of colon CPT copyright 2022 American [employer] Association. All rights reserved. The codes documented in this report are preliminary and upon coder review may  be revised to meet current compliance requirements. Ruel Kung, MD Ruel Kung MD, MD 12/21/2023 11:56:16 AM This report has been signed electronically. Number of Addenda: 0 Note Initiated On: 12/21/2023 11:38 AM Scope Withdrawal Time: 0 hours 3 minutes 31 seconds  Total Procedure Duration: 0 hours 8 minutes 24 seconds  Estimated Blood Loss:  Estimated blood loss: none.      [facility]

## 2023-12-21 NOTE — Anesthesia Preprocedure Evaluation (Signed)
 Anesthesia Evaluation  Patient identified by MRN, date of birth, ID band Patient awake    Reviewed: Allergy & Precautions, H&P , NPO status , Patient's Chart, lab work & pertinent test results  Airway Mallampati: II  TM Distance: >3 FB Neck ROM: full    Dental no notable dental hx.    Pulmonary asthma , COPD, former smoker   Pulmonary exam normal        Cardiovascular hypertension, negative cardio ROS Normal cardiovascular exam     Neuro/Psych negative neurological ROS  negative psych ROS   GI/Hepatic negative GI ROS, Neg liver ROS,,,  Endo/Other  negative endocrine ROS    Renal/GU negative Renal ROS  negative genitourinary   Musculoskeletal  (+) Arthritis ,    Abdominal   Peds  Hematology negative hematology ROS (+)   Anesthesia Other Findings Past Medical History: No date: Arthritis No date: Asthma No date: Complication of anesthesia     Comment:  Benadryl [Diphenhydramine] recieved after surgery and               was difficult to wake up No date: COPD (chronic obstructive pulmonary disease) (HCC) No date: Dyspnea     Comment:  DOE No date: Fatty liver No date: Glaucoma No date: Heart murmur 2000: History of methicillin resistant staphylococcus aureus (MRSA) No date: Hypertension  Past Surgical History: 03/23/2017: CATARACT EXTRACTION W/PHACO; Right     Comment:  Procedure: CATARACT EXTRACTION PHACO AND INTRAOCULAR               LENS PLACEMENT (IOC);  Surgeon: Jaye Fallow, MD;                Location: [facility];  Service: Ophthalmology;  Laterality:              Right;  US  00:36.7 AP% 13.2 CDE 4.84 Fluid Pack lot #               [id number] No date: DILATION AND CURETTAGE OF UTERUS No date: SHOULER; Left     Comment:  rotator cuff No date: THORACOTOMY     Comment:  WEDGE RESECTION 05/09/2019: TOTAL KNEE ARTHROPLASTY; Left     Comment:  Procedure: LEFT TOTAL KNEE ARTHROPLASTY;  Surgeon:  Kathlynn Sharper, MD;  Location: [facility];  Service: Orthopedics;               Laterality: Left; 12/02/2021: TOTAL KNEE REVISION; Left     Comment:  Procedure: TOTAL KNEE REVISION;  Surgeon: Leora Lynwood SAUNDERS, MD;  Location: [facility];  Service: Orthopedics;                Laterality: Left;     Reproductive/Obstetrics negative OB ROS                              Anesthesia Physical Anesthesia Plan  ASA: 3  Anesthesia Plan: General   Post-op Pain Management:    Induction:   PONV Risk Score and Plan: Propofol  infusion and TIVA  Airway Management Planned:   Additional Equipment:   Intra-op Plan:   Post-operative Plan:   Informed Consent:      Dental Advisory Given  Plan Discussed with: CRNA and Surgeon  Anesthesia Plan Comments:          Anesthesia  Quick Evaluation

## 2023-12-21 NOTE — Transfer of Care (Addendum)
 Immediate Anesthesia Transfer of Care Note  Patient: Jasmine Sullivan  Procedure(s) Performed: COLONOSCOPY  Patient Location: Endoscopy Unit  Anesthesia Type:General  Level of Consciousness: awake, drowsy, and patient cooperative  Airway & Oxygen Therapy: Patient Spontanous Breathing and Patient connected to face mask oxygen  Post-op Assessment: Report given to RN and Post -op Vital signs reviewed and stable  Post vital signs: Reviewed and stable  Last Vitals:  Vitals Value Taken Time  BP 109/63 12/21/23 12:09  Temp    Pulse 69 12/21/23 12:11  Resp 16 12/21/23 12:11  SpO2 100 % 12/21/23 12:11  Vitals shown include unfiled device data.  Last Pain:  Vitals:   12/21/23 1209  TempSrc:   PainSc: 0-No pain         Complications: No notable events documented.

## 2023-12-21 NOTE — Anesthesia Postprocedure Evaluation (Signed)
 Anesthesia Post Note  Patient: Jasmine Sullivan  Procedure(s) Performed: COLONOSCOPY  Patient location during evaluation: PACU Anesthesia Type: General Level of consciousness: awake and alert Pain management: pain level controlled Vital Signs Assessment: post-procedure vital signs reviewed and stable Respiratory status: spontaneous breathing, nonlabored ventilation and respiratory function stable Cardiovascular status: blood pressure returned to baseline and stable Postop Assessment: no apparent nausea or vomiting Anesthetic complications: no   No notable events documented.   Last Vitals:  Vitals:   12/21/23 1218 12/21/23 1226  BP: (!) 190/108 (!) 171/78  Pulse: 60   Resp: 17 (!) 23  Temp:    SpO2: 94%     Last Pain:  Vitals:   12/21/23 1218  TempSrc:   PainSc: 0-No pain                 [name]

## 2023-12-24 ENCOUNTER — Encounter: Payer: Self-pay | Admitting: Internal Medicine

## 2023-12-24 ENCOUNTER — Ambulatory Visit: Admitting: Internal Medicine

## 2023-12-24 VITALS — BP 170/70 | HR 82 | Ht 66.5 in | Wt 164.2 lb

## 2023-12-24 DIAGNOSIS — R011 Cardiac murmur, unspecified: Secondary | ICD-10-CM

## 2023-12-24 DIAGNOSIS — I10 Essential (primary) hypertension: Secondary | ICD-10-CM | POA: Diagnosis not present

## 2023-12-24 DIAGNOSIS — E782 Mixed hyperlipidemia: Secondary | ICD-10-CM

## 2023-12-24 DIAGNOSIS — L4050 Arthropathic psoriasis, unspecified: Secondary | ICD-10-CM

## 2023-12-24 DIAGNOSIS — L304 Erythema intertrigo: Secondary | ICD-10-CM | POA: Diagnosis not present

## 2023-12-24 DIAGNOSIS — R7302 Impaired glucose tolerance (oral): Secondary | ICD-10-CM

## 2023-12-24 MED ORDER — RALOXIFENE HCL 60 MG PO TABS: 60.0000 mg | ORAL_TABLET | Freq: Every day | ORAL | 3 refills | Status: DC

## 2023-12-24 MED ORDER — RALOXIFENE HCL 60 MG PO TABS: 60.0000 mg | ORAL_TABLET | Freq: Every day | ORAL | 3 refills | Status: AC

## 2023-12-24 MED ORDER — CLOTRIMAZOLE-BETAMETHASONE 1-0.05 % EX CREA
1.0000 | TOPICAL_CREAM | Freq: Two times a day (BID) | CUTANEOUS | 3 refills | Status: DC
Start: 2023-12-24 — End: 2024-02-01

## 2023-12-24 NOTE — Progress Notes (Signed)
 Established Patient Office Visit  Subjective:  Patient ID: Jasmine Sullivan, female    DOB: 01-Jun-1945  Age: 78 y.o. MRN: [id number]  Chief Complaint  Patient presents with   Follow-up    Low heart rate with high BP    Patient is seen today for elevated blood pressure and low heart rate. BP is elevated today at the office but her pulse in within normal range. Patient concerned about recent multiple elevated blood pressure  readings.And wondered what could be done. Will increase her Valsartan to 160 mg once daily. Will currently keep lasix  daily as prescribed. Also reports Ophthalmologist says her Glaucoma has worsened and in the last 5 months she is unable to read anything that isn't large font. She has an optic nerve test coming up soon but unsure of date. Overall, she is feeling well and has no additional complaints at this time. Will repeat an echo to monitor murmur; last echo was in 2018 and showed mild mitral valve regurgitation.    No other concerns at this time.   Past Medical History:  Diagnosis Date   Arthritis    Asthma    Complication of anesthesia    Benadryl [Diphenhydramine] recieved after surgery and was difficult to wake up   Dyspnea    DOE   Fatty liver    Glaucoma    Heart murmur    History of methicillin resistant staphylococcus aureus (MRSA) 2000   Hypertension     Past Surgical History:  Procedure Laterality Date   CATARACT EXTRACTION W/PHACO Right 03/23/2017   Procedure: CATARACT EXTRACTION PHACO AND INTRAOCULAR LENS PLACEMENT (IOC);  Surgeon: Jaye Fallow, MD;  Location: [facility];  Service: Ophthalmology;  Laterality: Right;  US  00:36.7 AP% 13.2 CDE 4.84 Fluid Pack lot # [id number]   COLONOSCOPY N/A 12/21/2023   Procedure: COLONOSCOPY;  Surgeon: Therisa Bi, MD;  Location: [facility];  Service: Gastroenterology;  Laterality: N/A;   DILATION AND CURETTAGE OF UTERUS     SHOULER Left    rotator cuff   THORACOTOMY     WEDGE RESECTION   TOTAL  KNEE ARTHROPLASTY Left 05/09/2019   Procedure: LEFT TOTAL KNEE ARTHROPLASTY;  Surgeon: Kathlynn Sharper, MD;  Location: [facility];  Service: Orthopedics;  Laterality: Left;   TOTAL KNEE REVISION Left 12/02/2021   Procedure: TOTAL KNEE REVISION;  Surgeon: Leora Lynwood SAUNDERS, MD;  Location: [facility];  Service: Orthopedics;  Laterality: Left;    Social History   Socioeconomic History   Marital status: Divorced    Spouse name: Not on file   Number of children: Not on file   Years of education: Not on file   Highest education level: Not on file  Occupational History   Not on file  Tobacco Use   Smoking status: Former   Smokeless tobacco: Never   Tobacco comments:    45 years ago  Vaping Use   Vaping status: Never Used  Substance and Sexual Activity   Alcohol  use: Yes    Alcohol /week: 14.0 standard drinks of alcohol     Types: 14 Cans of beer per week   Drug use: Never   Sexual activity: Not on file  Other Topics Concern   Not on file  Social History Narrative   Lives alone   Social Drivers of Health   Financial Resource Strain: Medium Risk (12/15/2023)   Received from [facility]   Overall Financial Resource Strain (CARDIA)    Difficulty of Paying Living Expenses: Somewhat hard  Food Insecurity: No Food Insecurity (12/15/2023)   Received [facility]   Hunger Vital Sign    Within the past 12 months, you worried that your food would run out before you got the money to buy more.: Never true    Within the past 12 months, the food you bought just didn't last and you didn't have money to get more.: Never true  Transportation Needs: No Transportation Needs (12/15/2023)   Received from [facility] - Transportation    In the past 12 months, has lack of transportation kept you from medical appointments or from getting medications?: No    Lack of Transportation (Non-Medical): No  Physical Activity: Not on file  Stress: Not on  file  Social Connections: Not on file  Intimate Partner Violence: Not on file    Family History  Problem Relation Age of Onset   Breast cancer Mother 55    Allergies  Allergen Reactions   Paroxetine Hcl Itching   Simbrinza [Brinzolamide-Brimonidine] Other (See Comments)    Dry mouth, itching eyes, eczema eyes - tolerates with [place] eye drops   Benadryl [Diphenhydramine]     Glaucoma (caused optic nerve damage)   Chlorhexidine  Gluconate    Crab [Shellfish Allergy] Swelling    And itching   Amlodipine Rash   Benzonatate Rash   Chlorhexidine  Rash   Erythromycin Other (See Comments)    Stomach issues   Nickel Rash    Outpatient Medications Prior to Visit  Medication Sig   albuterol  (VENTOLIN  HFA) 108 (90 Base) MCG/ACT inhaler TAKE 2 PUFFS BY MOUTH 4 TIMES A DAY AS NEEDED   Calcium  Carb-Cholecalciferol  (CALCIUM  600+D) 600-800 MG-UNIT TABS Take 1 tablet by mouth daily at 12 noon.    cetirizine  (ZYRTEC ) 10 MG tablet Take 10 mg at bedtime by mouth.   fluticasone (FLONASE) 50 MCG/ACT nasal spray USE 2 SPRAYS IN EACH NOSTRIL EVERY DAY   folic acid (FOLVITE) 1 MG tablet Take 1 mg by mouth daily.   furosemide  (LASIX ) 20 MG tablet TAKE 1 TABLET BY MOUTH EVERY DAY   GEMTESA  75 MG TABS TAKE 1 TABLET BY MOUTH EVERY DAY   methotrexate (RHEUMATREX) 10 MG tablet Take 60 mg by mouth once a week. Caution: Chemotherapy. Protect from light.   montelukast  (SINGULAIR ) 10 MG tablet TAKE 1 TABLET BY MOUTH EVERY DAY   Omega-3 Fatty Acids (FISH OIL) 1000 MG CAPS Take 3,000 mg by mouth daily at 12 noon.    simvastatin  (ZOCOR ) 10 MG tablet TAKE 1 TABLET BY MOUTH EVERY DAY   valsartan (DIOVAN) 80 MG tablet TAKE 1 TABLET BY MOUTH EVERY DAY   [DISCONTINUED] raloxifene  (EVISTA ) 60 MG tablet Take 60 mg by mouth daily at 12 noon.    aspirin  81 MG chewable tablet Chew 1 tablet (81 mg total) by mouth 2 (two) times daily. (Patient not taking: Reported on 12/24/2023)   nystatin (MYCOSTATIN/NYSTOP) powder APPLY  TO AFFECTED AREA TWICE A DAY (Patient not taking: Reported on 12/24/2023)   No facility-administered medications prior to visit.    Review of Systems  Constitutional: Negative.   HENT: Negative.    Eyes:  Positive for blurred vision.  Respiratory: Negative.  Negative for cough and shortness of breath.   Cardiovascular: Negative.  Negative for chest pain, palpitations and leg swelling.  Gastrointestinal: Negative.  Negative for abdominal pain, constipation, diarrhea, heartburn, nausea and vomiting.  Genitourinary: Negative.  Negative for dysuria and flank pain.  Musculoskeletal: Negative.  Negative for joint pain and myalgias.  Skin: Negative.   Neurological: Negative.  Negative for dizziness, tingling, tremors and headaches.  Endo/Heme/Allergies: Negative.   Psychiatric/Behavioral: Negative.  Negative for depression and suicidal ideas. The patient is not nervous/anxious.        Objective:   BP (!) 170/70   Pulse 82   Ht 5' 6.5 (1.689 m)   Wt 164 lb 3.2 oz (74.5 kg)   SpO2 98%   BMI 26.11 kg/m   Vitals:   12/24/23 1416  BP: (!) 170/70  Pulse: 82  Height: 5' 6.5 (1.689 m)  Weight: 164 lb 3.2 oz (74.5 kg)  SpO2: 98%  BMI (Calculated): 26.11    Physical Exam Vitals and nursing note reviewed.  Constitutional:      Appearance: Normal appearance.  HENT:     Head: Normocephalic and atraumatic.     Nose: Nose normal.     Mouth/Throat:     Mouth: Mucous membranes are moist.     Pharynx: Oropharynx is clear.  Eyes:     Conjunctiva/sclera: Conjunctivae normal.     Pupils: Pupils are equal, round, and reactive to light.  Cardiovascular:     Rate and Rhythm: Normal rate and regular rhythm.     Pulses: Normal pulses.     Heart sounds: Murmur heard.  Pulmonary:     Effort: Pulmonary effort is normal.     Breath sounds: Normal breath sounds. No wheezing.  Abdominal:     General: Bowel sounds are normal.     Palpations: Abdomen is soft.     Tenderness: There is no  abdominal tenderness. There is no right CVA tenderness or left CVA tenderness.  Musculoskeletal:        General: Normal range of motion.     Cervical back: Normal range of motion.     Right lower leg: No edema.     Left lower leg: No edema.  Skin:    General: Skin is warm and dry.  Neurological:     General: No focal deficit present.     Mental Status: She is alert and oriented to person, place, and time.  Psychiatric:        Mood and Affect: Mood normal.        Behavior: Behavior normal.      No results found for any visits on 12/24/23.  No results found for this or any previous visit (from the past 2160 hours).    Assessment & Plan:  Continue medications as prescribed. Increase Valsartan to 160 mg once daily. Will check routine labs and review results with patient. Refill Lotrisone  cream. Echo ordered for murmur. Problem List Items Addressed This Visit     Hyperlipidemia   Relevant Orders   Lipid Panel w/o Chol/HDL Ratio   Essential hypertension, benign - Primary   Relevant Orders   CBC with Diff   CMP14+EGFR   Impaired glucose tolerance   Relevant Orders   Hemoglobin A1c   Psoriatic arthritis (HCC)   Murmur   Relevant Orders   PCV ECHOCARDIOGRAM COMPLETE   Intertrigo   Relevant Medications   clotrimazole -betamethasone  (LOTRISONE ) cream    Return in about 10 days (around 01/03/2024).   Total time spent: 30 minutes  FERNAND FREDY RAMAN, MD  12/24/2023   This document may have been prepared by [employer] Voice Recognition software and as such may include unintentional dictation errors.

## 2023-12-28 ENCOUNTER — Other Ambulatory Visit

## 2023-12-28 DIAGNOSIS — E782 Mixed hyperlipidemia: Secondary | ICD-10-CM | POA: Diagnosis not present

## 2023-12-28 DIAGNOSIS — R7302 Impaired glucose tolerance (oral): Secondary | ICD-10-CM | POA: Diagnosis not present

## 2023-12-28 DIAGNOSIS — I10 Essential (primary) hypertension: Secondary | ICD-10-CM | POA: Diagnosis not present

## 2023-12-29 LAB — CMP14+EGFR
ALT: 32 IU/L (ref 0–32)
AST: 26 IU/L (ref 0–40)
Albumin: 4.4 g/dL (ref 3.8–4.8)
Alkaline Phosphatase: 75 IU/L (ref 44–121)
BUN/Creatinine Ratio: 14 (ref 12–28)
BUN: 11 mg/dL (ref 8–27)
Bilirubin Total: 0.6 mg/dL (ref 0.0–1.2)
CO2: 24 mmol/L (ref 20–29)
Calcium: 9.3 mg/dL (ref 8.7–10.3)
Chloride: 100 mmol/L (ref 96–106)
Creatinine, Ser: 0.78 mg/dL (ref 0.57–1.00)
Globulin, Total: 2 g/dL (ref 1.5–4.5)
Glucose: 97 mg/dL (ref 70–99)
Potassium: 3.9 mmol/L (ref 3.5–5.2)
Sodium: 138 mmol/L (ref 134–144)
Total Protein: 6.4 g/dL (ref 6.0–8.5)
eGFR: 78 mL/min/1.73 (ref >59)

## 2023-12-29 LAB — CBC WITH DIFFERENTIAL/PLATELET
Basophils Absolute: 0 x10E3/uL (ref 0.0–0.2)
Basos: 0 %
EOS (ABSOLUTE): 0.1 x10E3/uL (ref 0.0–0.4)
Eos: 1 %
Hematocrit: 45.8 % (ref 34.0–46.6)
Hemoglobin: 15.1 g/dL (ref 11.1–15.9)
Immature Grans (Abs): 0 x10E3/uL (ref 0.0–0.1)
Immature Granulocytes: 0 %
Lymphocytes Absolute: 1.6 x10E3/uL (ref 0.7–3.1)
Lymphs: 24 %
MCH: 32.3 pg (ref 26.6–33.0)
MCHC: 33 g/dL (ref 31.5–35.7)
MCV: 98 fL — ABNORMAL HIGH (ref 79–97)
Monocytes Absolute: 0.6 x10E3/uL (ref 0.1–0.9)
Monocytes: 10 %
Neutrophils Absolute: 4.2 x10E3/uL (ref 1.4–7.0)
Neutrophils: 65 %
Platelets: 256 x10E3/uL (ref 150–450)
RBC: 4.67 x10E6/uL (ref 3.77–5.28)
RDW: 13.8 % (ref 11.7–15.4)
WBC: 6.5 x10E3/uL (ref 3.4–10.8)

## 2023-12-29 LAB — LIPID PANEL W/O CHOL/HDL RATIO
Cholesterol, Total: 128 mg/dL (ref 100–199)
HDL: 43 mg/dL (ref >39)
LDL Chol Calc (NIH): 62 mg/dL (ref 0–99)
Triglycerides: 131 mg/dL (ref 0–149)
VLDL Cholesterol Cal: 23 mg/dL (ref 5–40)

## 2023-12-29 LAB — HEMOGLOBIN A1C
Est. average glucose Bld gHb Est-mCnc: 128 mg/dL
Hgb A1c MFr Bld: 6.1 % — ABNORMAL HIGH (ref 4.8–5.6)

## 2023-12-30 ENCOUNTER — Ambulatory Visit: Payer: Self-pay | Admitting: Internal Medicine

## 2023-12-30 DIAGNOSIS — Z7960 Long term (current) use of unspecified immunomodulators and immunosuppressants: Secondary | ICD-10-CM | POA: Diagnosis not present

## 2023-12-30 DIAGNOSIS — L409 Psoriasis, unspecified: Secondary | ICD-10-CM | POA: Diagnosis not present

## 2023-12-30 DIAGNOSIS — M150 Primary generalized (osteo)arthritis: Secondary | ICD-10-CM | POA: Diagnosis not present

## 2023-12-30 DIAGNOSIS — L4050 Arthropathic psoriasis, unspecified: Secondary | ICD-10-CM | POA: Diagnosis not present

## 2024-01-04 ENCOUNTER — Ambulatory Visit (INDEPENDENT_AMBULATORY_CARE_PROVIDER_SITE_OTHER): Admitting: Internal Medicine

## 2024-01-04 ENCOUNTER — Encounter: Payer: Self-pay | Admitting: Internal Medicine

## 2024-01-04 VITALS — BP 150/70 | HR 84 | Ht 66.5 in | Wt 164.0 lb

## 2024-01-04 DIAGNOSIS — R7303 Prediabetes: Secondary | ICD-10-CM | POA: Diagnosis not present

## 2024-01-04 DIAGNOSIS — I10 Essential (primary) hypertension: Secondary | ICD-10-CM | POA: Diagnosis not present

## 2024-01-04 DIAGNOSIS — R011 Cardiac murmur, unspecified: Secondary | ICD-10-CM | POA: Diagnosis not present

## 2024-01-04 DIAGNOSIS — E782 Mixed hyperlipidemia: Secondary | ICD-10-CM | POA: Diagnosis not present

## 2024-01-04 MED ORDER — VALSARTAN-HYDROCHLOROTHIAZIDE 160-25 MG PO TABS: 1.0000 | ORAL_TABLET | Freq: Every day | ORAL | 3 refills | Status: DC

## 2024-01-04 NOTE — Progress Notes (Signed)
 Established Patient Office Visit  Subjective:  Patient ID: Jasmine Sullivan, female    DOB: 03/10/1946  Age: 78 y.o. MRN: [id number]  Chief Complaint  Patient presents with   Follow-up    10 day follow up    Patient is here today to follow up on her recent visit where her blood pressure was elevated to 170/70; we increased her Losartan at that time to 160 mg  daily. Today her blood pressure was 150/70 and 158/78 when rechecked. Patient reports her diastolic blood pressure at home is always in the 90s. Encouraged patient to bring her blood pressure machine with her to her next appointment to make sure it is accurate with the readings we are getting in the office. Will switch her blood pressure medication to Valsartan -hydrochlorothiazide  160-25 mg once daily. Patient advised to stop lasix  and plain Valsartan  currently. Patient denies headache, chest pain. She does endorse some shortness of breath. We previously ordered an echo at her last visit which she has scheduled to be completed this Friday. Outside of patients HTN and shortness of breath she reports she is doing well and has no additional complaints.      No other concerns at this time.   Past Medical History:  Diagnosis Date   Arthritis    Asthma    Complication of anesthesia    Benadryl [Diphenhydramine] recieved after surgery and was difficult to wake up   Dyspnea    DOE   Fatty liver    Glaucoma    Heart murmur    History of methicillin resistant staphylococcus aureus (MRSA) 2000   Hypertension     Past Surgical History:  Procedure Laterality Date   CATARACT EXTRACTION W/PHACO Right 03/23/2017   Procedure: CATARACT EXTRACTION PHACO AND INTRAOCULAR LENS PLACEMENT (IOC);  Surgeon: Jaye Fallow, MD;  Location: [facility] ORS;  Service: Ophthalmology;  Laterality: Right;  US  00:36.7 AP% 13.2 CDE 4.84 Fluid Pack lot # [id number]   COLONOSCOPY N/A 12/21/2023   Procedure: COLONOSCOPY;  Surgeon: Therisa Bi, MD;  Location:  [facility] ENDOSCOPY;  Service: Gastroenterology;  Laterality: N/A;   DILATION AND CURETTAGE OF UTERUS     SHOULER Left    rotator cuff   THORACOTOMY     WEDGE RESECTION   TOTAL KNEE ARTHROPLASTY Left 05/09/2019   Procedure: LEFT TOTAL KNEE ARTHROPLASTY;  Surgeon: Kathlynn Sharper, MD;  Location: [facility] ORS;  Service: Orthopedics;  Laterality: Left;   TOTAL KNEE REVISION Left 12/02/2021   Procedure: TOTAL KNEE REVISION;  Surgeon: Leora Lynwood SAUNDERS, MD;  Location: [facility] ORS;  Service: Orthopedics;  Laterality: Left;    Social History   Socioeconomic History   Marital status: Divorced    Spouse name: Not on file   Number of children: Not on file   Years of education: Not on file   Highest education level: Not on file  Occupational History   Not on file  Tobacco Use   Smoking status: Former   Smokeless tobacco: Never   Tobacco comments:    45 years ago  Vaping Use   Vaping status: Never Used  Substance and Sexual Activity   Alcohol  use: Yes    Alcohol /week: 14.0 standard drinks of alcohol     Types: 14 Cans of beer per week   Drug use: Never   Sexual activity: Not on file  Other Topics Concern   Not on file  Social History Narrative   Lives alone   Social Drivers of Health   Financial Resource Strain: Medium  Risk (12/15/2023)   Received from [facility]   Overall Financial Resource Strain (CARDIA)    Difficulty of Paying Living Expenses: Somewhat hard  Food Insecurity: No Food Insecurity (12/15/2023)   Received from [facility]   Hunger Vital Sign    Within the past 12 months, you worried that your food would run out before you got the money to buy more.: Never true    Within the past 12 months, the food you bought just didn't last and you didn't have money to get more.: Never true  Transportation Needs: No Transportation Needs (12/15/2023)   Received from [facility] - Transportation    In the past 12 months, has lack of  transportation kept you from medical appointments or from getting medications?: No    Lack of Transportation (Non-Medical): No  Physical Activity: Not on file  Stress: Not on file  Social Connections: Not on file  Intimate Partner Violence: Not on file    Family History  Problem Relation Age of Onset   Breast cancer Mother 89    Allergies  Allergen Reactions   Paroxetine Hcl Itching   Simbrinza [Brinzolamide-Brimonidine] Other (See Comments)    Dry mouth, itching eyes, eczema eyes - tolerates with [place] eye drops   Benadryl [Diphenhydramine]     Glaucoma (caused optic nerve damage)   Chlorhexidine  Gluconate    Crab [Shellfish Allergy] Swelling    And itching   Amlodipine Rash   Benzonatate Rash   Chlorhexidine  Rash   Erythromycin Other (See Comments)    Stomach issues   Nickel Rash    Outpatient Medications Prior to Visit  Medication Sig Note   albuterol  (VENTOLIN  HFA) 108 (90 Base) MCG/ACT inhaler TAKE 2 PUFFS BY MOUTH 4 TIMES A DAY AS NEEDED    Calcium  Carb-Cholecalciferol  (CALCIUM  600+D) 600-800 MG-UNIT TABS Take 1 tablet by mouth daily at 12 noon.     cetirizine  (ZYRTEC ) 10 MG tablet Take 10 mg at bedtime by mouth.    clotrimazole -betamethasone  (LOTRISONE ) cream Apply 1 Application topically 2 (two) times daily.    fluticasone (FLONASE) 50 MCG/ACT nasal spray USE 2 SPRAYS IN EACH NOSTRIL EVERY DAY    folic acid (FOLVITE) 1 MG tablet Take 1 mg by mouth daily.    furosemide  (LASIX ) 20 MG tablet TAKE 1 TABLET BY MOUTH EVERY DAY 01/04/2024: Stop lasix    GEMTESA  75 MG TABS TAKE 1 TABLET BY MOUTH EVERY DAY    methotrexate (RHEUMATREX) 10 MG tablet Take 60 mg by mouth once a week. Caution: Chemotherapy. Protect from light.    montelukast  (SINGULAIR ) 10 MG tablet TAKE 1 TABLET BY MOUTH EVERY DAY    Omega-3 Fatty Acids (FISH OIL) 1000 MG CAPS Take 3,000 mg by mouth daily at 12 noon.     raloxifene  (EVISTA ) 60 MG tablet Take 1 tablet (60 mg total) by mouth daily at 12 noon.     simvastatin  (ZOCOR ) 10 MG tablet TAKE 1 TABLET BY MOUTH EVERY DAY    [DISCONTINUED] valsartan  (DIOVAN ) 80 MG tablet TAKE 1 TABLET BY MOUTH EVERY DAY    aspirin  81 MG chewable tablet Chew 1 tablet (81 mg total) by mouth 2 (two) times daily. (Patient not taking: Reported on 01/04/2024)    nystatin (MYCOSTATIN/NYSTOP) powder APPLY TO AFFECTED AREA TWICE A DAY (Patient not taking: Reported on 01/04/2024)    No facility-administered medications prior to visit.    Review of Systems  Constitutional: Negative.  Negative for  fever.  HENT: Negative.    Eyes: Negative.   Respiratory:  Positive for shortness of breath. Negative for cough.   Cardiovascular: Negative.  Negative for chest pain, palpitations and leg swelling.  Gastrointestinal: Negative.  Negative for abdominal pain, constipation, diarrhea, heartburn, nausea and vomiting.  Genitourinary: Negative.  Negative for dysuria and flank pain.  Musculoskeletal: Negative.  Negative for joint pain and myalgias.  Skin: Negative.   Neurological: Negative.  Negative for dizziness, tingling, tremors, sensory change, speech change, focal weakness and headaches.  Endo/Heme/Allergies: Negative.   Psychiatric/Behavioral: Negative.  Negative for depression and suicidal ideas. The patient is not nervous/anxious.        Objective:   BP (!) 150/70   Pulse 84   Ht 5' 6.5 (1.689 m)   Wt 164 lb (74.4 kg)   SpO2 98%   BMI 26.07 kg/m   Vitals:   01/04/24 1002  BP: (!) 150/70  Pulse: 84  Height: 5' 6.5 (1.689 m)  Weight: 164 lb (74.4 kg)  SpO2: 98%  BMI (Calculated): 26.08    Physical Exam Vitals and nursing note reviewed.  Constitutional:      Appearance: Normal appearance.  HENT:     Head: Normocephalic and atraumatic.     Nose: Nose normal.     Mouth/Throat:     Mouth: Mucous membranes are moist.     Pharynx: Oropharynx is clear.  Eyes:     Conjunctiva/sclera: Conjunctivae normal.     Pupils: Pupils are equal, round, and reactive to  light.  Cardiovascular:     Rate and Rhythm: Normal rate and regular rhythm.     Pulses: Normal pulses.     Heart sounds: Murmur heard.  Pulmonary:     Effort: Pulmonary effort is normal.     Breath sounds: Normal breath sounds. No wheezing.  Abdominal:     General: Bowel sounds are normal.     Palpations: Abdomen is soft.     Tenderness: There is no abdominal tenderness. There is no right CVA tenderness or left CVA tenderness.  Musculoskeletal:        General: Normal range of motion.     Cervical back: Normal range of motion.     Right lower leg: No edema.     Left lower leg: No edema.  Skin:    General: Skin is warm and dry.  Neurological:     General: No focal deficit present.     Mental Status: She is alert and oriented to person, place, and time.  Psychiatric:        Mood and Affect: Mood normal.        Behavior: Behavior normal.      No results found for any visits on 01/04/24.  Recent Results (from the past 2160 hours)  CBC with Diff     Status: Abnormal   Collection Time: 12/28/23  9:21 AM  Result Value Ref Range   WBC 6.5 3.4 - 10.8 x10E3/uL   RBC 4.67 3.77 - 5.28 x10E6/uL   Hemoglobin 15.1 11.1 - 15.9 g/dL   Hematocrit 54.1 65.9 - 46.6 %   MCV 98 (H) 79 - 97 fL   MCH 32.3 26.6 - 33.0 pg   MCHC 33.0 31.5 - 35.7 g/dL   RDW 86.1 88.2 - 84.5 %   Platelets 256 150 - 450 x10E3/uL   Neutrophils 65 Not Estab. %   Lymphs 24 Not Estab. %   Monocytes 10 Not Estab. %   Eos 1 Not  Estab. %   Basos 0 Not Estab. %   Neutrophils Absolute 4.2 1.4 - 7.0 x10E3/uL   Lymphocytes Absolute 1.6 0.7 - 3.1 x10E3/uL   Monocytes Absolute 0.6 0.1 - 0.9 x10E3/uL   EOS (ABSOLUTE) 0.1 0.0 - 0.4 x10E3/uL   Basophils Absolute 0.0 0.0 - 0.2 x10E3/uL   Immature Granulocytes 0 Not Estab. %   Immature Grans (Abs) 0.0 0.0 - 0.1 x10E3/uL  CMP14+EGFR     Status: None   Collection Time: 12/28/23  9:21 AM  Result Value Ref Range   Glucose 97 70 - 99 mg/dL   BUN 11 8 - 27 mg/dL    Creatinine, Ser 9.21 0.57 - 1.00 mg/dL   eGFR 78 >40 fO/fpw/8.26   BUN/Creatinine Ratio 14 12 - 28   Sodium 138 134 - 144 mmol/L   Potassium 3.9 3.5 - 5.2 mmol/L   Chloride 100 96 - 106 mmol/L   CO2 24 20 - 29 mmol/L   Calcium  9.3 8.7 - 10.3 mg/dL   Total Protein 6.4 6.0 - 8.5 g/dL   Albumin 4.4 3.8 - 4.8 g/dL   Globulin, Total 2.0 1.5 - 4.5 g/dL   Bilirubin Total 0.6 0.0 - 1.2 mg/dL   Alkaline Phosphatase 75 44 - 121 IU/L    Comment: **Effective January 10, 2024 Alkaline Phosphatase**   reference interval will be changing to:              Age                Female          Female           0 -  5 days         47 - 127       47 - 127           6 - 10 days         29 - 242       29 - 242          11 - 20 days        109 - 357      109 - 357          21 - 30 days         94 - 494       94 - 494           1 -  2 months      149 - 539      149 - 539           3 -  6 months      131 - 452      131 - 452           7 - 11 months      117 - 401      117 - 401   12 months -  6 years       158 - 369      158 - 369           7 - 12 years       150 - 409      150 - 409               13 years       156 - 435       78 - 227  14 years       114 - 375       64 - 161               15 years        88 - 279       56 - 134               16 years        74 - 207       51 - 121               17 years        63 - 161       47 - 113          18 - 20 years        51 - 125       42 - 106          21 - 50 years         47 - 123       41 - 116          51 - 80 years        49 - 135       51 - 125              >80 years        48 - 129       48 - 129    AST 26 0 - 40 IU/L   ALT 32 0 - 32 IU/L  Lipid Panel w/o Chol/HDL Ratio     Status: None   Collection Time: 12/28/23  9:21 AM  Result Value Ref Range   Cholesterol, Total 128 100 - 199 mg/dL   Triglycerides 868 0 - 149 mg/dL   HDL 43 >60 mg/dL   VLDL Cholesterol Cal 23 5 - 40 mg/dL   LDL Chol Calc (NIH) 62 0 - 99 mg/dL  Hemoglobin J8r      Status: Abnormal   Collection Time: 12/28/23  9:21 AM  Result Value Ref Range   Hgb A1c MFr Bld 6.1 (H) 4.8 - 5.6 %    Comment:          Prediabetes: 5.7 - 6.4          Diabetes: >6.4          Glycemic control for adults with diabetes: <7.0    Est. average glucose Bld gHb Est-mCnc 128 mg/dL      Assessment & Plan:  Start taking Valsartan -hydrochlorothiazide  160-25 mg once daily. Stop taking losartan 160 mg. Stop taking lasix  20 mg once daily. Continue monitoring blood pressure at home. Keep upcoming echo appointment.  Problem List Items Addressed This Visit     Hyperlipidemia   Relevant Medications   valsartan -hydrochlorothiazide  (DIOVAN -HCT) 160-25 MG tablet   Essential hypertension, benign - Primary   Relevant Medications   valsartan -hydrochlorothiazide  (DIOVAN -HCT) 160-25 MG tablet   Murmur   Other Visit Diagnoses       Prediabetes           Return in about 2 weeks (around 01/18/2024).   Total time spent:  FERNAND FREDY RAMAN, MD  01/04/2024   This document may have been prepared by [facility] Voice Recognition software and as such may include unintentional dictation errors.

## 2024-01-07 ENCOUNTER — Ambulatory Visit (INDEPENDENT_AMBULATORY_CARE_PROVIDER_SITE_OTHER)

## 2024-01-07 DIAGNOSIS — I340 Nonrheumatic mitral (valve) insufficiency: Secondary | ICD-10-CM | POA: Diagnosis not present

## 2024-01-07 DIAGNOSIS — I361 Nonrheumatic tricuspid (valve) insufficiency: Secondary | ICD-10-CM

## 2024-01-07 DIAGNOSIS — R011 Cardiac murmur, unspecified: Secondary | ICD-10-CM

## 2024-01-17 ENCOUNTER — Ambulatory Visit: Admitting: Cardiology

## 2024-01-17 ENCOUNTER — Encounter: Payer: Self-pay | Admitting: Cardiology

## 2024-01-17 VITALS — BP 130/64 | HR 84 | Ht 66.5 in | Wt 159.6 lb

## 2024-01-17 DIAGNOSIS — J22 Unspecified acute lower respiratory infection: Secondary | ICD-10-CM | POA: Diagnosis not present

## 2024-01-17 MED ORDER — AZITHROMYCIN 250 MG PO TABS: ORAL_TABLET | ORAL | 0 refills | Status: AC

## 2024-01-17 NOTE — Progress Notes (Signed)
 Established Patient Office Visit  Subjective:  Patient ID: Jasmine Sullivan, female    DOB: 1945/05/06  Age: 78 y.o. MRN: [id number]  Chief Complaint  Patient presents with   Acute Visit    Woke up last Thursday with a sore throat went to [facility] and took sore throat spray and Mucinex er she has also used cough drops and taken 2 Covid tests that came out to be negative. Congested in her chest coughing up yellow/green flem.     Patient in office for an acute visit, complaining of cough, sore throat, PND, chest congestion that started last Thursday. Thick yellow mucous. Two home covid tests, both negative. Sore throat left worse than right. Right upper lobe wheezing.  Patient reports using her inhaler as prescribed, nasal spray, Mucinex, cough drops with out relief. Continue current medications. Will send in a Z-pack. Patient reports taking and tolerating azithromycin  in the past.   URI  This is a new problem. The current episode started in the past 7 days. The problem has been unchanged. Associated symptoms include chest pain, congestion, coughing, rhinorrhea and a sore throat. Pertinent negatives include no abdominal pain, diarrhea, ear pain, headaches, joint pain, sinus pain, sneezing or wheezing. She has tried inhaler use (Mucinex, throat spray) for the symptoms. The treatment provided no relief.    No other concerns at this time.   Past Medical History:  Diagnosis Date   Arthritis    Asthma    Complication of anesthesia    Benadryl [Diphenhydramine] recieved after surgery and was difficult to wake up   Dyspnea    DOE   Fatty liver    Glaucoma    Heart murmur    History of methicillin resistant staphylococcus aureus (MRSA) 2000   Hypertension     Past Surgical History:  Procedure Laterality Date   CATARACT EXTRACTION W/PHACO Right 03/23/2017   Procedure: CATARACT EXTRACTION PHACO AND INTRAOCULAR LENS PLACEMENT (IOC);  Surgeon: Jaye Fallow, MD;  Location: [facility] ORS;   Service: Ophthalmology;  Laterality: Right;  US  00:36.7 AP% 13.2 CDE 4.84 Fluid Pack lot # [id number]   COLONOSCOPY N/A 12/21/2023   Procedure: COLONOSCOPY;  Surgeon: Therisa Bi, MD;  Location: [facility];  Service: Gastroenterology;  Laterality: N/A;   DILATION AND CURETTAGE OF UTERUS     SHOULER Left    rotator cuff   THORACOTOMY     WEDGE RESECTION   TOTAL KNEE ARTHROPLASTY Left 05/09/2019   Procedure: LEFT TOTAL KNEE ARTHROPLASTY;  Surgeon: Kathlynn Sharper, MD;  Location: [facility] ORS;  Service: Orthopedics;  Laterality: Left;   TOTAL KNEE REVISION Left 12/02/2021   Procedure: TOTAL KNEE REVISION;  Surgeon: Leora Lynwood SAUNDERS, MD;  Location: [facility] ORS;  Service: Orthopedics;  Laterality: Left;    Social History   Socioeconomic History   Marital status: Divorced    Spouse name: Not on file   Number of children: Not on file   Years of education: Not on file   Highest education level: Not on file  Occupational History   Not on file  Tobacco Use   Smoking status: Former   Smokeless tobacco: Never   Tobacco comments:    45 years ago  Vaping Use   Vaping status: Never Used  Substance and Sexual Activity   Alcohol  use: Yes    Alcohol /week: 14.0 standard drinks of alcohol     Types: 14 Cans of beer per week   Drug use: Never   Sexual activity: Not on file  Other Topics  Concern   Not on file  Social History Narrative   Lives alone   Social Drivers of Health   Financial Resource Strain: Medium Risk (12/15/2023)   Received from [facility]   Overall Financial Resource Strain (CARDIA)    Difficulty of Paying Living Expenses: Somewhat hard  Food Insecurity: No Food Insecurity (12/15/2023)   Received from [facility]   Hunger Vital Sign    Within the past 12 months, you worried that your food would run out before you got the money to buy more.: Never true    Within the past 12 months, the food you bought just didn't last and you didn't have money to  get more.: Never true  Transportation Needs: No Transportation Needs (12/15/2023)   Received from [facility] - Transportation    In the past 12 months, has lack of transportation kept you from medical appointments or from getting medications?: No    Lack of Transportation (Non-Medical): No  Physical Activity: Not on file  Stress: Not on file  Social Connections: Not on file  Intimate Partner Violence: Not on file    Family History  Problem Relation Age of Onset   Breast cancer Mother 47    Allergies  Allergen Reactions   Paroxetine Hcl Itching   Simbrinza [Brinzolamide-Brimonidine] Other (See Comments)    Dry mouth, itching eyes, eczema eyes - tolerates with [place] eye drops   Benadryl [Diphenhydramine]     Glaucoma (caused optic nerve damage)   Chlorhexidine  Gluconate    Crab [Shellfish Allergy] Swelling    And itching   Amlodipine Rash   Benzonatate Rash   Chlorhexidine  Rash   Erythromycin Other (See Comments)    Stomach issues   Nickel Rash    Outpatient Medications Prior to Visit  Medication Sig   albuterol  (VENTOLIN  HFA) 108 (90 Base) MCG/ACT inhaler TAKE 2 PUFFS BY MOUTH 4 TIMES A DAY AS NEEDED   Calcium  Carb-Cholecalciferol  (CALCIUM  600+D) 600-800 MG-UNIT TABS Take 1 tablet by mouth daily at 12 noon.    cetirizine  (ZYRTEC ) 10 MG tablet Take 10 mg at bedtime by mouth.   clotrimazole -betamethasone  (LOTRISONE ) cream Apply 1 Application topically 2 (two) times daily.   fluticasone (FLONASE) 50 MCG/ACT nasal spray USE 2 SPRAYS IN EACH NOSTRIL EVERY DAY   folic acid (FOLVITE) 1 MG tablet Take 1 mg by mouth daily.   GEMTESA  75 MG TABS TAKE 1 TABLET BY MOUTH EVERY DAY   methotrexate (RHEUMATREX) 10 MG tablet Take 60 mg by mouth once a week. Caution: Chemotherapy. Protect from light.   montelukast  (SINGULAIR ) 10 MG tablet TAKE 1 TABLET BY MOUTH EVERY DAY   Omega-3 Fatty Acids (FISH OIL) 1000 MG CAPS Take 3,000 mg by mouth daily at 12 noon.     raloxifene  (EVISTA ) 60 MG tablet Take 1 tablet (60 mg total) by mouth daily at 12 noon.   simvastatin  (ZOCOR ) 10 MG tablet TAKE 1 TABLET BY MOUTH EVERY DAY   valsartan -hydrochlorothiazide  (DIOVAN -HCT) 160-25 MG tablet Take 1 tablet by mouth daily.   aspirin  81 MG chewable tablet Chew 1 tablet (81 mg total) by mouth 2 (two) times daily. (Patient not taking: Reported on 01/17/2024)   furosemide  (LASIX ) 20 MG tablet TAKE 1 TABLET BY MOUTH EVERY DAY (Patient not taking: Reported on 01/17/2024)   nystatin (MYCOSTATIN/NYSTOP) powder APPLY TO AFFECTED AREA TWICE A DAY (Patient not taking: Reported on 01/17/2024)   No facility-administered medications prior to visit.  Review of Systems  Constitutional: Negative.   HENT:  Positive for congestion, rhinorrhea and sore throat. Negative for ear discharge, ear pain, sinus pain and sneezing.   Eyes: Negative.   Respiratory:  Positive for cough and sputum production. Negative for shortness of breath and wheezing.   Cardiovascular:  Positive for chest pain.  Gastrointestinal: Negative.  Negative for abdominal pain, constipation and diarrhea.  Genitourinary: Negative.   Musculoskeletal:  Negative for joint pain and myalgias.  Skin: Negative.   Neurological: Negative.  Negative for dizziness and headaches.  Endo/Heme/Allergies: Negative.   All other systems reviewed and are negative.      Objective:   BP 130/64   Pulse 84   Ht 5' 6.5 (1.689 m)   Wt 159 lb 9.6 oz (72.4 kg)   SpO2 98%   BMI 25.37 kg/m   Vitals:   01/17/24 0921  BP: 130/64  Pulse: 84  Height: 5' 6.5 (1.689 m)  Weight: 159 lb 9.6 oz (72.4 kg)  SpO2: 98%  BMI (Calculated): 25.38    Physical Exam Vitals and nursing note reviewed.  Constitutional:      Appearance: Normal appearance. She is normal weight.  HENT:     Head: Normocephalic and atraumatic.     Nose: Nose normal.     Mouth/Throat:     Mouth: Mucous membranes are moist.  Eyes:     Extraocular Movements:  Extraocular movements intact.     Conjunctiva/sclera: Conjunctivae normal.     Pupils: Pupils are equal, round, and reactive to light.  Cardiovascular:     Rate and Rhythm: Normal rate and regular rhythm.     Pulses: Normal pulses.     Heart sounds: Normal heart sounds.  Pulmonary:     Effort: Pulmonary effort is normal.     Breath sounds: No stridor. Wheezing present. No rhonchi or rales.  Abdominal:     General: Abdomen is flat. Bowel sounds are normal.     Palpations: Abdomen is soft.  Musculoskeletal:        General: Normal range of motion.     Cervical back: Normal range of motion.  Skin:    General: Skin is warm and dry.  Neurological:     General: No focal deficit present.     Mental Status: She is alert and oriented to person, place, and time.  Psychiatric:        Mood and Affect: Mood normal.        Behavior: Behavior normal.        Thought Content: Thought content normal.        Judgment: Judgment normal.      No results found for any visits on 01/17/24.  Recent Results (from the past 2160 hours)  CBC with Diff     Status: Abnormal   Collection Time: 12/28/23  9:21 AM  Result Value Ref Range   WBC 6.5 3.4 - 10.8 x10E3/uL   RBC 4.67 3.77 - 5.28 x10E6/uL   Hemoglobin 15.1 11.1 - 15.9 g/dL   Hematocrit 54.1 65.9 - 46.6 %   MCV 98 (H) 79 - 97 fL   MCH 32.3 26.6 - 33.0 pg   MCHC 33.0 31.5 - 35.7 g/dL   RDW 86.1 88.2 - 84.5 %   Platelets 256 150 - 450 x10E3/uL   Neutrophils 65 Not Estab. %   Lymphs 24 Not Estab. %   Monocytes 10 Not Estab. %   Eos 1 Not Estab. %   Basos 0 Not  Estab. %   Neutrophils Absolute 4.2 1.4 - 7.0 x10E3/uL   Lymphocytes Absolute 1.6 0.7 - 3.1 x10E3/uL   Monocytes Absolute 0.6 0.1 - 0.9 x10E3/uL   EOS (ABSOLUTE) 0.1 0.0 - 0.4 x10E3/uL   Basophils Absolute 0.0 0.0 - 0.2 x10E3/uL   Immature Granulocytes 0 Not Estab. %   Immature Grans (Abs) 0.0 0.0 - 0.1 x10E3/uL  CMP14+EGFR     Status: None   Collection Time: 12/28/23  9:21 AM   Result Value Ref Range   Glucose 97 70 - 99 mg/dL   BUN 11 8 - 27 mg/dL   Creatinine, Ser 9.21 0.57 - 1.00 mg/dL   eGFR 78 >40 fO/fpw/8.26   BUN/Creatinine Ratio 14 12 - 28   Sodium 138 134 - 144 mmol/L   Potassium 3.9 3.5 - 5.2 mmol/L   Chloride 100 96 - 106 mmol/L   CO2 24 20 - 29 mmol/L   Calcium  9.3 8.7 - 10.3 mg/dL   Total Protein 6.4 6.0 - 8.5 g/dL   Albumin 4.4 3.8 - 4.8 g/dL   Globulin, Total 2.0 1.5 - 4.5 g/dL   Bilirubin Total 0.6 0.0 - 1.2 mg/dL   Alkaline Phosphatase 75 44 - 121 IU/L    Comment: **Effective January 10, 2024 Alkaline Phosphatase**   reference interval will be changing to:              Age                Female          Female           0 -  5 days         47 - 127       47 - 127           6 - 10 days         29 - 242       29 - 242          11 - 20 days        109 - 357      109 - 357          21 - 30 days         94 - 494       94 - 494           1 -  2 months      149 - 539      149 - 539           3 -  6 months      131 - 452      131 - 452           7 - 11 months      117 - 401      117 - 401   12 months -  6 years       158 - 369      158 - 369           7 - 12 years       150 - 409      150 - 409               13 years       156 - 435       78 - 227               14 years  114 - 375       64 - 161               15 years        88 - 279       56 - 134               16 years        74 - 207       51 - 121               17 years        63 - 161       47 - 113          18 - 20 years        51 - 125       42 - 106          21 - 50 years         47 - 123       41 - 116          51 - 80 years        49 - 135       51 - 125              >80 years        48 - 129       48 - 129    AST 26 0 - 40 IU/L   ALT 32 0 - 32 IU/L  Lipid Panel w/o Chol/HDL Ratio     Status: None   Collection Time: 12/28/23  9:21 AM  Result Value Ref Range   Cholesterol, Total 128 100 - 199 mg/dL   Triglycerides 868 0 - 149 mg/dL   HDL 43 >60 mg/dL   VLDL Cholesterol  Cal 23 5 - 40 mg/dL   LDL Chol Calc (NIH) 62 0 - 99 mg/dL  Hemoglobin J8r     Status: Abnormal   Collection Time: 12/28/23  9:21 AM  Result Value Ref Range   Hgb A1c MFr Bld 6.1 (H) 4.8 - 5.6 %    Comment:          Prediabetes: 5.7 - 6.4          Diabetes: >6.4          Glycemic control for adults with diabetes: <7.0    Est. average glucose Bld gHb Est-mCnc 128 mg/dL      Assessment & Plan:  Z-pack Mucinex Nasal spray Drink plenty of water Current antihistamines Cough drops  Problem List Items Addressed This Visit       Respiratory   Lower resp. tract infection - Primary   Relevant Medications   azithromycin  (ZITHROMAX  Z-PAK) 250 MG tablet    Return if symptoms worsen or fail to improve, for as scheduled.   Total time spent: 25 minutes  Google, NP  01/17/2024   This document may have been prepared by Dragon Voice Recognition software and as such may include unintentional dictation errors.

## 2024-01-18 ENCOUNTER — Ambulatory Visit: Admitting: Internal Medicine

## 2024-01-18 ENCOUNTER — Encounter: Payer: Self-pay | Admitting: Internal Medicine

## 2024-01-18 VITALS — BP 140/60 | HR 95 | Ht 66.5 in | Wt 163.2 lb

## 2024-01-18 DIAGNOSIS — J301 Allergic rhinitis due to pollen: Secondary | ICD-10-CM | POA: Insufficient documentation

## 2024-01-18 DIAGNOSIS — I10 Essential (primary) hypertension: Secondary | ICD-10-CM | POA: Diagnosis not present

## 2024-01-18 DIAGNOSIS — E782 Mixed hyperlipidemia: Secondary | ICD-10-CM

## 2024-01-18 DIAGNOSIS — J22 Unspecified acute lower respiratory infection: Secondary | ICD-10-CM | POA: Diagnosis not present

## 2024-01-18 DIAGNOSIS — J040 Acute laryngitis: Secondary | ICD-10-CM

## 2024-01-18 MED ORDER — METHYLPREDNISOLONE 4 MG PO TBPK: ORAL_TABLET | ORAL | 0 refills | Status: DC

## 2024-01-18 NOTE — Progress Notes (Signed)
 Established Patient Office Visit  Subjective:  Patient ID: Jasmine Sullivan, female    DOB: 1945-05-24  Age: 78 y.o. MRN: [id number]  Chief Complaint  Patient presents with   Follow-up    2 week follow up    Patient comes in for follow-up today.  Her blood pressure medications have been adjusted and today it looks good.  Her wrist blood pressure monitor was calibrated today and is giving accurate readings at this time.  Patient is tolerating valsartan /hydrochlorothiazide . She was in the office yesterday with cough and congestion, negative COVID testing x 2 and started on Z-Pak.  She is still congested and her voice is strained due to laryngitis.  Will send in a prescription for Medrol  Dosepak, while she continues her Z-Pak and Mucinex.    No other concerns at this time.   Past Medical History:  Diagnosis Date   Arthritis    Asthma    Complication of anesthesia    Benadryl [Diphenhydramine] recieved after surgery and was difficult to wake up   Dyspnea    DOE   Fatty liver    Glaucoma    Heart murmur    History of methicillin resistant staphylococcus aureus (MRSA) 2000   Hypertension     Past Surgical History:  Procedure Laterality Date   CATARACT EXTRACTION W/PHACO Right 03/23/2017   Procedure: CATARACT EXTRACTION PHACO AND INTRAOCULAR LENS PLACEMENT (IOC);  Surgeon: Jaye Fallow, MD;  Location: [facility] ORS;  Service: Ophthalmology;  Laterality: Right;  US  00:36.7 AP% 13.2 CDE 4.84 Fluid Pack lot # [id number]   COLONOSCOPY N/A 12/21/2023   Procedure: COLONOSCOPY;  Surgeon: Therisa Bi, MD;  Location: [facility] ENDOSCOPY;  Service: Gastroenterology;  Laterality: N/A;   DILATION AND CURETTAGE OF UTERUS     SHOULER Left    rotator cuff   THORACOTOMY     WEDGE RESECTION   TOTAL KNEE ARTHROPLASTY Left 05/09/2019   Procedure: LEFT TOTAL KNEE ARTHROPLASTY;  Surgeon: Kathlynn Sharper, MD;  Location: [facility] ORS;  Service: Orthopedics;  Laterality: Left;   TOTAL KNEE REVISION Left  12/02/2021   Procedure: TOTAL KNEE REVISION;  Surgeon: Leora Lynwood SAUNDERS, MD;  Location: [facility] ORS;  Service: Orthopedics;  Laterality: Left;    Social History   Socioeconomic History   Marital status: Divorced    Spouse name: Not on file   Number of children: Not on file   Years of education: Not on file   Highest education level: Not on file  Occupational History   Not on file  Tobacco Use   Smoking status: Former   Smokeless tobacco: Never   Tobacco comments:    45 years ago  Vaping Use   Vaping status: Never Used  Substance and Sexual Activity   Alcohol  use: Yes    Alcohol /week: 14.0 standard drinks of alcohol     Types: 14 Cans of beer per week   Drug use: Never   Sexual activity: Not on file  Other Topics Concern   Not on file  Social History Narrative   Lives alone   Social Drivers of Health   Financial Resource Strain: Medium Risk (12/15/2023)   Received from [facility]   Overall Financial Resource Strain (CARDIA)    Difficulty of Paying Living Expenses: Somewhat hard  Food Insecurity: No Food Insecurity (12/15/2023)   Received from [facility]   Hunger Vital Sign    Within the past 12 months, you worried that your food would run out before you got  the money to buy more.: Never true    Within the past 12 months, the food you bought just didn't last and you didn't have money to get more.: Never true  Transportation Needs: No Transportation Needs (12/15/2023)   Received from [facility] - Transportation    In the past 12 months, has lack of transportation kept you from medical appointments or from getting medications?: No    Lack of Transportation (Non-Medical): No  Physical Activity: Not on file  Stress: Not on file  Social Connections: Not on file  Intimate Partner Violence: Not on file    Family History  Problem Relation Age of Onset   Breast cancer Mother 63    Allergies  Allergen Reactions    Paroxetine Hcl Itching   Simbrinza [Brinzolamide-Brimonidine] Other (See Comments)    Dry mouth, itching eyes, eczema eyes - tolerates with [place] eye drops   Benadryl [Diphenhydramine]     Glaucoma (caused optic nerve damage)   Chlorhexidine  Gluconate    Crab [Shellfish Allergy] Swelling    And itching   Amlodipine Rash   Benzonatate Rash   Chlorhexidine  Rash   Erythromycin Other (See Comments)    Stomach issues   Nickel Rash    Outpatient Medications Prior to Visit  Medication Sig   albuterol  (VENTOLIN  HFA) 108 (90 Base) MCG/ACT inhaler TAKE 2 PUFFS BY MOUTH 4 TIMES A DAY AS NEEDED   azithromycin  (ZITHROMAX  Z-PAK) 250 MG tablet Take 2 tablets (500 mg) on  Day 1,  followed by 1 tablet (250 mg) once daily on Days 2 through 5.   Calcium  Carb-Cholecalciferol  (CALCIUM  600+D) 600-800 MG-UNIT TABS Take 1 tablet by mouth daily at 12 noon.    cetirizine  (ZYRTEC ) 10 MG tablet Take 10 mg at bedtime by mouth.   clotrimazole -betamethasone  (LOTRISONE ) cream Apply 1 Application topically 2 (two) times daily.   fluticasone (FLONASE) 50 MCG/ACT nasal spray USE 2 SPRAYS IN EACH NOSTRIL EVERY DAY   folic acid (FOLVITE) 1 MG tablet Take 1 mg by mouth daily.   GEMTESA  75 MG TABS TAKE 1 TABLET BY MOUTH EVERY DAY   methotrexate (RHEUMATREX) 10 MG tablet Take 60 mg by mouth once a week. Caution: Chemotherapy. Protect from light.   montelukast  (SINGULAIR ) 10 MG tablet TAKE 1 TABLET BY MOUTH EVERY DAY   Omega-3 Fatty Acids (FISH OIL) 1000 MG CAPS Take 3,000 mg by mouth daily at 12 noon.    raloxifene  (EVISTA ) 60 MG tablet Take 1 tablet (60 mg total) by mouth daily at 12 noon.   simvastatin  (ZOCOR ) 10 MG tablet TAKE 1 TABLET BY MOUTH EVERY DAY   valsartan -hydrochlorothiazide  (DIOVAN -HCT) 160-25 MG tablet Take 1 tablet by mouth daily.   aspirin  81 MG chewable tablet Chew 1 tablet (81 mg total) by mouth 2 (two) times daily. (Patient not taking: Reported on 01/18/2024)   furosemide  (LASIX ) 20 MG tablet TAKE  1 TABLET BY MOUTH EVERY DAY (Patient not taking: Reported on 01/18/2024)   nystatin (MYCOSTATIN/NYSTOP) powder APPLY TO AFFECTED AREA TWICE A DAY (Patient not taking: Reported on 01/18/2024)   No facility-administered medications prior to visit.    Review of Systems  Constitutional: Negative.  Negative for chills, fever and malaise/fatigue.  HENT: Negative.  Negative for congestion and sore throat.   Eyes: Negative.  Negative for blurred vision and pain.  Respiratory: Negative.  Negative for cough and shortness of breath.   Cardiovascular: Negative.  Negative for chest pain, palpitations and leg swelling.  Gastrointestinal: Negative.  Negative for abdominal pain, blood in stool, constipation, diarrhea, heartburn, melena, nausea and vomiting.  Genitourinary: Negative.  Negative for dysuria, flank pain, frequency and urgency.  Musculoskeletal: Negative.  Negative for joint pain and myalgias.  Skin: Negative.   Neurological: Negative.  Negative for dizziness, tingling, sensory change, weakness and headaches.  Endo/Heme/Allergies: Negative.   Psychiatric/Behavioral: Negative.  Negative for depression and suicidal ideas. The patient is not nervous/anxious.        Objective:   BP (!) 140/60   Pulse 95   Ht 5' 6.5 (1.689 m)   Wt 163 lb 3.2 oz (74 kg)   SpO2 93%   BMI 25.95 kg/m   Vitals:   01/18/24 1049  BP: (!) 140/60  Pulse: 95  Height: 5' 6.5 (1.689 m)  Weight: 163 lb 3.2 oz (74 kg)  SpO2: 93%  BMI (Calculated): 25.95    Physical Exam Vitals and nursing note reviewed.  Constitutional:      Appearance: Normal appearance.  HENT:     Head: Normocephalic and atraumatic.     Nose: Nose normal.     Mouth/Throat:     Mouth: Mucous membranes are moist.     Pharynx: Oropharynx is clear.  Eyes:     Conjunctiva/sclera: Conjunctivae normal.     Pupils: Pupils are equal, round, and reactive to light.  Cardiovascular:     Rate and Rhythm: Normal rate and regular rhythm.      Pulses: Normal pulses.     Heart sounds: Normal heart sounds. No murmur heard. Pulmonary:     Effort: Pulmonary effort is normal.     Breath sounds: Normal breath sounds. No wheezing.  Abdominal:     General: Bowel sounds are normal.     Palpations: Abdomen is soft.     Tenderness: There is no abdominal tenderness. There is no right CVA tenderness or left CVA tenderness.  Musculoskeletal:        General: Normal range of motion.     Cervical back: Normal range of motion.     Right lower leg: No edema.     Left lower leg: No edema.  Skin:    General: Skin is warm and dry.  Neurological:     General: No focal deficit present.     Mental Status: She is alert and oriented to person, place, and time.  Psychiatric:        Mood and Affect: Mood normal.        Behavior: Behavior normal.      No results found for any visits on 01/18/24.  Recent Results (from the past 2160 hours)  CBC with Diff     Status: Abnormal   Collection Time: 12/28/23  9:21 AM  Result Value Ref Range   WBC 6.5 3.4 - 10.8 x10E3/uL   RBC 4.67 3.77 - 5.28 x10E6/uL   Hemoglobin 15.1 11.1 - 15.9 g/dL   Hematocrit 54.1 65.9 - 46.6 %   MCV 98 (H) 79 - 97 fL   MCH 32.3 26.6 - 33.0 pg   MCHC 33.0 31.5 - 35.7 g/dL   RDW 86.1 88.2 - 84.5 %   Platelets 256 150 - 450 x10E3/uL   Neutrophils 65 Not Estab. %   Lymphs 24 Not Estab. %   Monocytes 10 Not Estab. %   Eos 1 Not Estab. %   Basos 0 Not Estab. %   Neutrophils Absolute 4.2 1.4 - 7.0 x10E3/uL   Lymphocytes Absolute 1.6 0.7 - 3.1  x10E3/uL   Monocytes Absolute 0.6 0.1 - 0.9 x10E3/uL   EOS (ABSOLUTE) 0.1 0.0 - 0.4 x10E3/uL   Basophils Absolute 0.0 0.0 - 0.2 x10E3/uL   Immature Granulocytes 0 Not Estab. %   Immature Grans (Abs) 0.0 0.0 - 0.1 x10E3/uL  CMP14+EGFR     Status: None   Collection Time: 12/28/23  9:21 AM  Result Value Ref Range   Glucose 97 70 - 99 mg/dL   BUN 11 8 - 27 mg/dL   Creatinine, Ser 9.21 0.57 - 1.00 mg/dL   eGFR 78 >40 fO/fpw/8.26    BUN/Creatinine Ratio 14 12 - 28   Sodium 138 134 - 144 mmol/L   Potassium 3.9 3.5 - 5.2 mmol/L   Chloride 100 96 - 106 mmol/L   CO2 24 20 - 29 mmol/L   Calcium  9.3 8.7 - 10.3 mg/dL   Total Protein 6.4 6.0 - 8.5 g/dL   Albumin 4.4 3.8 - 4.8 g/dL   Globulin, Total 2.0 1.5 - 4.5 g/dL   Bilirubin Total 0.6 0.0 - 1.2 mg/dL   Alkaline Phosphatase 75 44 - 121 IU/L    Comment: **Effective January 10, 2024 Alkaline Phosphatase**   reference interval will be changing to:              Age                Female          Female           0 -  5 days         47 - 127       47 - 127           6 - 10 days         29 - 242       29 - 242          11 - 20 days        109 - 357      109 - 357          21 - 30 days         94 - 494       94 - 494           1 -  2 months      149 - 539      149 - 539           3 -  6 months      131 - 452      131 - 452           7 - 11 months      117 - 401      117 - 401   12 months -  6 years       158 - 369      158 - 369           7 - 12 years       150 - 409      150 - 409               13 years       156 - 435       78 - 227               14 years       114 - 375       64 - 161  15 years        88 - 279       56 - 134               16 years        74 - 207       51 - 121               17 years        63 - 161       47 - 113          18 - 20 years        51 - 125       42 - 106          21 - 50 years         47 - 123       41 - 116          51 - 80 years        49 - 135       51 - 125              >80 years        48 - 129       48 - 129    AST 26 0 - 40 IU/L   ALT 32 0 - 32 IU/L  Lipid Panel w/o Chol/HDL Ratio     Status: None   Collection Time: 12/28/23  9:21 AM  Result Value Ref Range   Cholesterol, Total 128 100 - 199 mg/dL   Triglycerides 868 0 - 149 mg/dL   HDL 43 >60 mg/dL   VLDL Cholesterol Cal 23 5 - 40 mg/dL   LDL Chol Calc (NIH) 62 0 - 99 mg/dL  Hemoglobin J8r     Status: Abnormal   Collection Time: 12/28/23  9:21 AM  Result  Value Ref Range   Hgb A1c MFr Bld 6.1 (H) 4.8 - 5.6 %    Comment:          Prediabetes: 5.7 - 6.4          Diabetes: >6.4          Glycemic control for adults with diabetes: <7.0    Est. average glucose Bld gHb Est-mCnc 128 mg/dL      Assessment & Plan:  To continue current medications.  Add Medrol  Dosepak.  Monitor blood pressure at home. Rest and fluids. Problem List Items Addressed This Visit     Hyperlipidemia   Essential hypertension, benign   Lower resp. tract infection   Seasonal allergic rhinitis due to pollen   Other Visit Diagnoses       Laryngitis    -  Primary   Relevant Medications   methylPREDNISolone  (MEDROL  DOSEPAK) 4 MG TBPK tablet       Return in about 3 weeks (around 02/08/2024).   Total time spent: 25 minutes  FERNAND FREDY RAMAN, MD  01/18/2024   This document may have been prepared by [facility] Voice Recognition software and as such may include unintentional dictation errors.

## 2024-01-31 ENCOUNTER — Ambulatory Visit: Admitting: Internal Medicine

## 2024-01-31 ENCOUNTER — Ambulatory Visit
Admission: RE | Admit: 2024-01-31 | Discharge: 2024-01-31 | Disposition: A | Discharge status: Home / Self Care | Attending: Internal Medicine | Admitting: Internal Medicine

## 2024-01-31 ENCOUNTER — Ambulatory Visit: Payer: Self-pay | Admitting: Internal Medicine

## 2024-01-31 ENCOUNTER — Encounter: Payer: Self-pay | Admitting: Internal Medicine

## 2024-01-31 ENCOUNTER — Ambulatory Visit
Admission: RE | Admit: 2024-01-31 | Discharge: 2024-01-31 | Disposition: A | Source: Ambulatory Visit | Attending: Internal Medicine | Admitting: Internal Medicine

## 2024-01-31 VITALS — BP 116/70 | HR 74 | Ht 66.5 in | Wt 162.8 lb

## 2024-01-31 DIAGNOSIS — J301 Allergic rhinitis due to pollen: Secondary | ICD-10-CM | POA: Diagnosis not present

## 2024-01-31 DIAGNOSIS — J040 Acute laryngitis: Secondary | ICD-10-CM

## 2024-01-31 DIAGNOSIS — R052 Subacute cough: Secondary | ICD-10-CM | POA: Insufficient documentation

## 2024-01-31 DIAGNOSIS — E782 Mixed hyperlipidemia: Secondary | ICD-10-CM | POA: Diagnosis not present

## 2024-01-31 DIAGNOSIS — I10 Essential (primary) hypertension: Secondary | ICD-10-CM

## 2024-01-31 DIAGNOSIS — R059 Cough, unspecified: Secondary | ICD-10-CM | POA: Diagnosis not present

## 2024-01-31 DIAGNOSIS — J40 Bronchitis, not specified as acute or chronic: Secondary | ICD-10-CM | POA: Diagnosis not present

## 2024-01-31 MED ORDER — PROMETHAZINE HCL 12.5 MG PO TABS: 12.5000 mg | ORAL_TABLET | Freq: Three times a day (TID) | ORAL | 0 refills | Status: DC | PRN

## 2024-01-31 NOTE — Progress Notes (Signed)
 Established Patient Office Visit  Subjective:  Patient ID: Jasmine Sullivan, female    DOB: 10/07/1945  Age: 78 y.o. MRN: [id number]  Chief Complaint  Patient presents with   Acute Visit    Laryngitis x 3 weeks     Patient comes in with complaints of persistent cough, hoarseness of voice, chest congestion.  She has completed the Medrol  Dosepak as well as Z-Pak, although feels constitutionally better but gets frequent bouts of coughing with minimal production of sputum which is usually clear and sometimes yellow.  No complaints of sore throat or difficulty swallowing.  She does not have any fevers or chills, no chest pain and no shortness of breath.  Will get a chest x-ray today.  Trial of Breztri inhaler, and prescription Phenergan for cough suppression. She is currently using her Mucinex, Flonase nasal spray, Zyrtec , Singulair  as well as albuterol  inhaler.    No other concerns at this time.   Past Medical History:  Diagnosis Date   Arthritis    Asthma    Complication of anesthesia    Benadryl [Diphenhydramine] recieved after surgery and was difficult to wake up   Dyspnea    DOE   Fatty liver    Glaucoma    Heart murmur    History of methicillin resistant staphylococcus aureus (MRSA) 2000   Hypertension     Past Surgical History:  Procedure Laterality Date   CATARACT EXTRACTION W/PHACO Right 03/23/2017   Procedure: CATARACT EXTRACTION PHACO AND INTRAOCULAR LENS PLACEMENT (IOC);  Surgeon: Jaye Fallow, MD;  Location: [facility] ORS;  Service: Ophthalmology;  Laterality: Right;  US  00:36.7 AP% 13.2 CDE 4.84 Fluid Pack lot # [id number]   COLONOSCOPY N/A 12/21/2023   Procedure: COLONOSCOPY;  Surgeon: Therisa Bi, MD;  Location: [facility];  Service: Gastroenterology;  Laterality: N/A;   DILATION AND CURETTAGE OF UTERUS     SHOULER Left    rotator cuff   THORACOTOMY     WEDGE RESECTION   TOTAL KNEE ARTHROPLASTY Left 05/09/2019   Procedure: LEFT TOTAL KNEE ARTHROPLASTY;   Surgeon: Kathlynn Sharper, MD;  Location: [facility] ORS;  Service: Orthopedics;  Laterality: Left;   TOTAL KNEE REVISION Left 12/02/2021   Procedure: TOTAL KNEE REVISION;  Surgeon: Leora Lynwood SAUNDERS, MD;  Location: [facility] OR[facility];  Service: Orthopedics;  Laterality: Left;    Social History   Socioeconomic History   Marital status: Divorced    Spouse name: Not on file   Number of children: Not on file   Years of education: Not on file   Highest education level: Not on file  Occupational History   Not on file  Tobacco Use   Smoking status: Former   Smokeless tobacco: Never   Tobacco comments:    45 years ago  Vaping Use   Vaping status: Never Used  Substance and Sexual Activity   Alcohol  use: Yes    Alcohol /week: 14.0 standard drinks of alcohol     Types: 14 Cans of beer per week   Drug use: Never   Sexual activity: Not on file  Other Topics Concern   Not on file  Social History Narrative   Lives alone   Social Drivers of Health   Financial Resource Strain: Medium Risk (12/15/2023)   Received [facility]   Overall Financial Resource Strain (CARDIA)    Difficulty of Paying Living Expenses: Somewhat hard  Food Insecurity: No Food Insecurity (12/15/2023)   Received from [facility]   Hunger Vital Sign  Within the past 12 months, you worried that your food would run out before you got the money to buy more.: Never true    Within the past 12 months, the food you bought just didn't last and you didn't have money to get more.: Never true  Transportation Needs: No Transportation Needs (12/15/2023)   Received from [facility] - Transportation    In the past 12 months, has lack of transportation kept you from medical appointments or from getting medications?: No    Lack of Transportation (Non-Medical): No  Physical Activity: Not on file  Stress: Not on file  Social Connections: Not on file  Intimate Partner Violence: Not on file     Family History  Problem Relation Age of Onset   Breast cancer Mother 28    Allergies  Allergen Reactions   Paroxetine Hcl Itching   Simbrinza [Brinzolamide-Brimonidine] Other (See Comments)    Dry mouth, itching eyes, eczema eyes - tolerates with [place] eye drops   Benadryl [Diphenhydramine]     Glaucoma (caused optic nerve damage)   Chlorhexidine  Gluconate    Crab [Shellfish Allergy] Swelling    And itching   Amlodipine Rash   Benzonatate Rash   Chlorhexidine  Rash   Erythromycin Other (See Comments)    Stomach issues   Nickel Rash    Outpatient Medications Prior to Visit  Medication Sig   albuterol  (VENTOLIN  HFA) 108 (90 Base) MCG/ACT inhaler TAKE 2 PUFFS BY MOUTH 4 TIMES A DAY AS NEEDED   Calcium  Carb-Cholecalciferol  (CALCIUM  600+D) 600-800 MG-UNIT TABS Take 1 tablet by mouth daily at 12 noon.    cetirizine  (ZYRTEC ) 10 MG tablet Take 10 mg at bedtime by mouth.   clotrimazole -betamethasone  (LOTRISONE ) cream Apply 1 Application topically 2 (two) times daily.   fluticasone (FLONASE) 50 MCG/ACT nasal spray USE 2 SPRAYS IN EACH NOSTRIL EVERY DAY   folic acid (FOLVITE) 1 MG tablet Take 1 mg by mouth daily.   GEMTESA  75 MG TABS TAKE 1 TABLET BY MOUTH EVERY DAY   methotrexate (RHEUMATREX) 10 MG tablet Take 60 mg by mouth once a week. Caution: Chemotherapy. Protect from light.   montelukast  (SINGULAIR ) 10 MG tablet TAKE 1 TABLET BY MOUTH EVERY DAY   Omega-3 Fatty Acids (FISH OIL) 1000 MG CAPS Take 3,000 mg by mouth daily at 12 noon.    raloxifene  (EVISTA ) 60 MG tablet Take 1 tablet (60 mg total) by mouth daily at 12 noon.   simvastatin  (ZOCOR ) 10 MG tablet TAKE 1 TABLET BY MOUTH EVERY DAY   valsartan -hydrochlorothiazide  (DIOVAN -HCT) 160-25 MG tablet Take 1 tablet by mouth daily.   aspirin  81 MG chewable tablet Chew 1 tablet (81 mg total) by mouth 2 (two) times daily. (Patient not taking: Reported on 01/31/2024)   furosemide  (LASIX ) 20 MG tablet TAKE 1 TABLET BY MOUTH EVERY DAY  (Patient not taking: Reported on 01/31/2024)   methylPREDNISolone  (MEDROL  DOSEPAK) 4 MG TBPK tablet Use as directed (Patient not taking: Reported on 01/31/2024)   nystatin (MYCOSTATIN/NYSTOP) powder APPLY TO AFFECTED AREA TWICE A DAY (Patient not taking: Reported on 01/31/2024)   No facility-administered medications prior to visit.    Review of Systems  Constitutional: Negative.  Negative for chills, fever and malaise/fatigue.  HENT: Negative.  Negative for congestion, sinus pain and sore throat.   Eyes: Negative.  Negative for blurred vision and pain.  Respiratory:  Positive for cough. Negative for hemoptysis, shortness of breath and stridor.   Cardiovascular: Negative.  Negative  for chest pain, palpitations and leg swelling.  Gastrointestinal: Negative.  Negative for abdominal pain, blood in stool, constipation, diarrhea, heartburn, melena, nausea and vomiting.  Genitourinary: Negative.  Negative for dysuria, flank pain, frequency and urgency.  Musculoskeletal: Negative.  Negative for joint pain and myalgias.  Skin: Negative.   Neurological: Negative.  Negative for dizziness, tingling, sensory change, weakness and headaches.  Endo/Heme/Allergies: Negative.   Psychiatric/Behavioral: Negative.  Negative for depression and suicidal ideas. The patient is not nervous/anxious.        Objective:   BP 116/70   Pulse 74   Ht 5' 6.5 (1.689 m)   Wt 162 lb 12.8 oz (73.8 kg)   SpO2 96%   BMI 25.88 kg/m   Vitals:   01/31/24 1350  BP: 116/70  Pulse: 74  Height: 5' 6.5 (1.689 m)  Weight: 162 lb 12.8 oz (73.8 kg)  SpO2: 96%  BMI (Calculated): 25.89    Physical Exam Vitals and nursing note reviewed.  Constitutional:      Appearance: Normal appearance.  HENT:     Head: Normocephalic and atraumatic.     Nose: Nose normal.     Mouth/Throat:     Mouth: Mucous membranes are moist.     Pharynx: Oropharynx is clear.  Eyes:     Conjunctiva/sclera: Conjunctivae normal.     Pupils:  Pupils are equal, round, and reactive to light.  Cardiovascular:     Rate and Rhythm: Normal rate and regular rhythm.     Pulses: Normal pulses.     Heart sounds: Normal heart sounds. No murmur heard. Pulmonary:     Effort: Pulmonary effort is normal.     Breath sounds: Normal breath sounds. No wheezing.  Abdominal:     General: Bowel sounds are normal.     Palpations: Abdomen is soft.     Tenderness: There is no abdominal tenderness. There is no right CVA tenderness or left CVA tenderness.  Musculoskeletal:        General: Normal range of motion.     Cervical back: Normal range of motion.     Right lower leg: No edema.     Left lower leg: No edema.  Skin:    General: Skin is warm and dry.  Neurological:     General: No focal deficit present.     Mental Status: She is alert and oriented to person, place, and time.  Psychiatric:        Mood and Affect: Mood normal.        Behavior: Behavior normal.      No results found for any visits on 01/31/24.  Recent Results (from the past 2160 hours)  CBC with Diff     Status: Abnormal   Collection Time: 12/28/23  9:21 AM  Result Value Ref Range   WBC 6.5 3.4 - 10.8 x10E3/uL   RBC 4.67 3.77 - 5.28 x10E6/uL   Hemoglobin 15.1 11.1 - 15.9 g/dL   Hematocrit 54.1 65.9 - 46.6 %   MCV 98 (H) 79 - 97 fL   MCH 32.3 26.6 - 33.0 pg   MCHC 33.0 31.5 - 35.7 g/dL   RDW 86.1 88.2 - 84.5 %   Platelets 256 150 - 450 x10E3/uL   Neutrophils 65 Not Estab. %   Lymphs 24 Not Estab. %   Monocytes 10 Not Estab. %   Eos 1 Not Estab. %   Basos 0 Not Estab. %   Neutrophils Absolute 4.2 1.4 - 7.0 x10E3/uL  Lymphocytes Absolute 1.6 0.7 - 3.1 x10E3/uL   Monocytes Absolute 0.6 0.1 - 0.9 x10E3/uL   EOS (ABSOLUTE) 0.1 0.0 - 0.4 x10E3/uL   Basophils Absolute 0.0 0.0 - 0.2 x10E3/uL   Immature Granulocytes 0 Not Estab. %   Immature Grans (Abs) 0.0 0.0 - 0.1 x10E3/uL  CMP14+EGFR     Status: None   Collection Time: 12/28/23  9:21 AM  Result Value Ref Range    Glucose 97 70 - 99 mg/dL   BUN 11 8 - 27 mg/dL   Creatinine, Ser 9.21 0.57 - 1.00 mg/dL   eGFR 78 >40 fO/fpw/8.26   BUN/Creatinine Ratio 14 12 - 28   Sodium 138 134 - 144 mmol/L   Potassium 3.9 3.5 - 5.2 mmol/L   Chloride 100 96 - 106 mmol/L   CO2 24 20 - 29 mmol/L   Calcium  9.3 8.7 - 10.3 mg/dL   Total Protein 6.4 6.0 - 8.5 g/dL   Albumin 4.4 3.8 - 4.8 g/dL   Globulin, Total 2.0 1.5 - 4.5 g/dL   Bilirubin Total 0.6 0.0 - 1.2 mg/dL   Alkaline Phosphatase 75 44 - 121 IU/L    Comment: **Effective January 10, 2024 Alkaline Phosphatase**   reference interval will be changing to:              Age                Female          Female           0 -  5 days         47 - 127       47 - 127           6 - 10 days         29 - 242       29 - 242          11 - 20 days        109 - 357      109 - 357          21 - 30 days         94 - 494       94 - 494           1 -  2 months      149 - 539      149 - 539           3 -  6 months      131 - 452      131 - 452           7 - 11 months      117 - 401      117 - 401   12 months -  6 years       158 - 369      158 - 369           7 - 12 years       150 - 409      150 - 409               13 years       156 - 435       78 - 227               14 years       114 - 375  64 - 161               15 years        88 - 279       56 - 134               16 years        74 - 207       51 - 121               17 years        63 - 161       47 - 113          18 - 20 years        51 - 125       42 - 106          21 - 50 years         47 - 123       41 - 116          51 - 80 years        49 - 135       51 - 125              >80 years        48 - 129       48 - 129    AST 26 0 - 40 IU/L   ALT 32 0 - 32 IU/L  Lipid Panel w/o Chol/HDL Ratio     Status: None   Collection Time: 12/28/23  9:21 AM  Result Value Ref Range   Cholesterol, Total 128 100 - 199 mg/dL   Triglycerides 868 0 - 149 mg/dL   HDL 43 >60 mg/dL   VLDL Cholesterol Cal 23 5 - 40 mg/dL    LDL Chol Calc (NIH) 62 0 - 99 mg/dL  Hemoglobin J8r     Status: Abnormal   Collection Time: 12/28/23  9:21 AM  Result Value Ref Range   Hgb A1c MFr Bld 6.1 (H) 4.8 - 5.6 %    Comment:          Prediabetes: 5.7 - 6.4          Diabetes: >6.4          Glycemic control for adults with diabetes: <7.0    Est. average glucose Bld gHb Est-mCnc 128 mg/dL      Assessment & Plan:  Check chest x-ray.  Samples of Breztri inhaler given.  Patient will continue her medications.  Consider ENT referral at follow-up. Problem List Items Addressed This Visit   None Visit Diagnoses       Subacute cough    -  Primary   Relevant Medications   promethazine (PHENERGAN) 12.5 MG tablet   Other Relevant Orders   DG Chest 2 View       Follow up 1 week as scheduled  Total time spent: 25 minutes  FERNAND FREDY RAMAN, MD  01/31/2024   This document may have been prepared by Dragon Voice Recognition software and as such may include unintentional dictation errors.

## 2024-02-01 ENCOUNTER — Other Ambulatory Visit: Payer: Self-pay | Admitting: Internal Medicine

## 2024-02-01 DIAGNOSIS — R21 Rash and other nonspecific skin eruption: Secondary | ICD-10-CM

## 2024-02-01 DIAGNOSIS — R052 Subacute cough: Secondary | ICD-10-CM

## 2024-02-01 MED ORDER — GUAIFENESIN-CODEINE 100-10 MG/5ML PO SOLN: 10.0000 mL | Freq: Three times a day (TID) | ORAL | 0 refills | Status: DC | PRN

## 2024-02-01 MED ORDER — NYSTATIN 100000 UNIT/GM EX OINT
1.0000 | TOPICAL_OINTMENT | Freq: Two times a day (BID) | CUTANEOUS | 0 refills | Status: AC
Start: 2024-02-01 — End: ?

## 2024-02-01 NOTE — Progress Notes (Signed)
 Patient has been informed to stop Phenergan as rx sent for Guafenesin-AC to be taken as needed for cough

## 2024-02-08 ENCOUNTER — Ambulatory Visit: Payer: Self-pay | Admitting: Internal Medicine

## 2024-02-08 ENCOUNTER — Ambulatory Visit (INDEPENDENT_AMBULATORY_CARE_PROVIDER_SITE_OTHER): Admitting: Internal Medicine

## 2024-02-08 ENCOUNTER — Encounter: Payer: Self-pay | Admitting: Internal Medicine

## 2024-02-08 VITALS — BP 122/60 | HR 87 | Ht 66.5 in | Wt 161.6 lb

## 2024-02-08 DIAGNOSIS — N761 Subacute and chronic vaginitis: Secondary | ICD-10-CM | POA: Diagnosis not present

## 2024-02-08 DIAGNOSIS — Z1389 Encounter for screening for other disorder: Secondary | ICD-10-CM | POA: Diagnosis not present

## 2024-02-08 DIAGNOSIS — N3281 Overactive bladder: Secondary | ICD-10-CM | POA: Diagnosis not present

## 2024-02-08 DIAGNOSIS — R300 Dysuria: Secondary | ICD-10-CM

## 2024-02-08 DIAGNOSIS — N3000 Acute cystitis without hematuria: Secondary | ICD-10-CM | POA: Diagnosis not present

## 2024-02-08 DIAGNOSIS — K219 Gastro-esophageal reflux disease without esophagitis: Secondary | ICD-10-CM

## 2024-02-08 DIAGNOSIS — I10 Essential (primary) hypertension: Secondary | ICD-10-CM

## 2024-02-08 DIAGNOSIS — Z1272 Encounter for screening for malignant neoplasm of vagina: Secondary | ICD-10-CM

## 2024-02-08 DIAGNOSIS — R052 Subacute cough: Secondary | ICD-10-CM | POA: Diagnosis not present

## 2024-02-08 DIAGNOSIS — Z131 Encounter for screening for diabetes mellitus: Secondary | ICD-10-CM

## 2024-02-08 DIAGNOSIS — J301 Allergic rhinitis due to pollen: Secondary | ICD-10-CM | POA: Diagnosis not present

## 2024-02-08 DIAGNOSIS — J040 Acute laryngitis: Secondary | ICD-10-CM

## 2024-02-08 LAB — POCT URINALYSIS DIPSTICK
Bilirubin, UA: NEGATIVE
Glucose, UA: NEGATIVE
Ketones, UA: NEGATIVE
Nitrite, UA: POSITIVE
Protein, UA: NEGATIVE
Spec Grav, UA: 1.015 (ref 1.010–1.025)
Urobilinogen, UA: 0.2 U/dL
pH, UA: 7 (ref 5.0–8.0)

## 2024-02-08 MED ORDER — AMOXICILLIN-POT CLAVULANATE 875-125 MG PO TABS: 1.0000 | ORAL_TABLET | Freq: Two times a day (BID) | ORAL | 0 refills | Status: DC

## 2024-02-08 MED ORDER — CIPROFLOXACIN HCL 500 MG PO TABS: 500.0000 mg | ORAL_TABLET | Freq: Two times a day (BID) | ORAL | 0 refills | Status: DC

## 2024-02-08 MED ORDER — PANTOPRAZOLE SODIUM 40 MG PO TBEC: 40.0000 mg | DELAYED_RELEASE_TABLET | Freq: Every day | ORAL | 1 refills | Status: DC

## 2024-02-08 NOTE — Progress Notes (Signed)
 Established Patient Office Visit  Subjective:  Patient ID: Jasmine Sullivan, female    DOB: 1945-06-28  Age: 78 y.o. MRN: [id number]  Chief Complaint  Patient presents with   Follow-up    3 week follow up    Patient comes in for her follow-up today.  She continues to complain of postnasal drip and tickle in her throat which makes her cough.  She feels that that her postnasal discharge gets hung up on the left side of her throat.  Her chest x-ray has been clear.  She has already done a round of Zithromax  and Medrol  Dosepak.  She is currently using her allergy medications including Flonase and Singulair .  She was given samples of Breztri inhaler at her last visit, for trial and Protonix also added.   Denies chest pain, no shortness of breath, no fevers and chills.  Will send referral to ENT for evaluation. Today patient also complains of dysuria and burning micturition.  Reports of increased frequency and waking up at night.  Initially she felt mild vaginal irritation and thought it was a yeast infection from the original Zithromax  she got a few weeks ago.  But the symptoms did not resolve and now she is actually feeling worse.  The urine dipstick in office shows large pus cells and is nitrite positive.  Will empirically start on p.o. Augmentin and send urine specimen for culture and sensitivity. Pelvic exam was done, and swabs collected.    No other concerns at this time.   Past Medical History:  Diagnosis Date   Arthritis    Asthma    Complication of anesthesia    Benadryl [Diphenhydramine] recieved after surgery and was difficult to wake up   Dyspnea    DOE   Fatty liver    Glaucoma    Heart murmur    History of methicillin resistant staphylococcus aureus (MRSA) 2000   Hypertension     Past Surgical History:  Procedure Laterality Date   CATARACT EXTRACTION W/PHACO Right 03/23/2017   Procedure: CATARACT EXTRACTION PHACO AND INTRAOCULAR LENS PLACEMENT (IOC);  Surgeon:  Jaye Fallow, MD;  Location: [facility] ORS;  Service: Ophthalmology;  Laterality: Right;  US  00:36.7 AP% 13.2 CDE 4.84 Fluid Pack lot # [id number]   COLONOSCOPY N/A 12/21/2023   Procedure: COLONOSCOPY;  Surgeon: Therisa Bi, MD;  Location: [facility] ENDOSCOPY;  Service: Gastroenterology;  Laterality: N/A;   DILATION AND CURETTAGE OF UTERUS     SHOULER Left    rotator cuff   THORACOTOMY     WEDGE RESECTION   TOTAL KNEE ARTHROPLASTY Left 05/09/2019   Procedure: LEFT TOTAL KNEE ARTHROPLASTY;  Surgeon: Kathlynn Sharper, MD;  Location: [facility] ORS;  Service: Orthopedics;  Laterality: Left;   TOTAL KNEE REVISION Left 12/02/2021   Procedure: TOTAL KNEE REVISION;  Surgeon: Leora Lynwood SAUNDERS, MD;  Location: [facility] ORS;  Service: Orthopedics;  Laterality: Left;    Social History   Socioeconomic History   Marital status: Divorced    Spouse name: Not on file   Number of children: Not on file   Years of education: Not on file   Highest education level: Not on file  Occupational History   Not on file  Tobacco Use   Smoking status: Former   Smokeless tobacco: Never   Tobacco comments:    45 years ago  Vaping Use   Vaping status: Never Used  Substance and Sexual Activity   Alcohol  use: Yes    Alcohol /week: 14.0 standard drinks of alcohol   Types: 14 Cans of beer per week   Drug use: Never   Sexual activity: Not on file  Other Topics Concern   Not on file  Social History Narrative   Lives alone   Social Drivers of Health   Financial Resource Strain: Medium Risk (12/15/2023)   Received from [facility]   Overall Financial Resource Strain (CARDIA)    Difficulty of Paying Living Expenses: Somewhat hard  Food Insecurity: No Food Insecurity (12/15/2023)   Received from [facility]   Hunger Vital Sign    Within the past 12 months, you worried that your food would run out before you got the money to buy more.: Never true    Within the past 12 months, the food you bought  just didn't last and you didn't have money to get more.: Never true  Transportation Needs: No Transportation Needs (12/15/2023)   Received from [facility]    In the past 12 months, has lack of transportation kept you from medical appointments or from getting medications?: No    Lack of Transportation (Non-Medical): No  Physical Activity: Not on file  Stress: Not on file  Social Connections: Not on file  Intimate Partner Violence: Not on file    Family History  Problem Relation Age of Onset   Breast cancer Mother 68    Allergies  Allergen Reactions   Paroxetine Hcl Itching   Simbrinza [Brinzolamide-Brimonidine] Other (See Comments)    Dry mouth, itching eyes, eczema eyes - tolerates with [place] eye drops   Benadryl [Diphenhydramine]     Glaucoma (caused optic nerve damage)   Chlorhexidine  Gluconate    Crab [Shellfish Allergy] Swelling    And itching   Amlodipine Rash   Benzonatate Rash   Chlorhexidine  Rash   Erythromycin Other (See Comments)    Stomach issues   Nickel Rash    Outpatient Medications Prior to Visit  Medication Sig   Calcium  Carb-Cholecalciferol  (CALCIUM  600+D) 600-800 MG-UNIT TABS Take 1 tablet by mouth daily at 12 noon.    cetirizine  (ZYRTEC ) 10 MG tablet Take 10 mg at bedtime by mouth.   fluticasone (FLONASE) 50 MCG/ACT nasal spray USE 2 SPRAYS IN EACH NOSTRIL EVERY DAY   folic acid (FOLVITE) 1 MG tablet Take 1 mg by mouth daily.   GEMTESA  75 MG TABS TAKE 1 TABLET BY MOUTH EVERY DAY   methotrexate (RHEUMATREX) 10 MG tablet Take 60 mg by mouth once a week. Caution: Chemotherapy. Protect from light.   montelukast  (SINGULAIR ) 10 MG tablet TAKE 1 TABLET BY MOUTH EVERY DAY   nystatin ointment (MYCOSTATIN) Apply 1 Application topically 2 (two) times daily.   Omega-3 Fatty Acids (FISH OIL) 1000 MG CAPS Take 3,000 mg by mouth daily at 12 noon.    raloxifene  (EVISTA ) 60 MG tablet Take 1 tablet (60 mg total) by mouth  daily at 12 noon.   simvastatin  (ZOCOR ) 10 MG tablet TAKE 1 TABLET BY MOUTH EVERY DAY   valsartan -hydrochlorothiazide  (DIOVAN -HCT) 160-25 MG tablet Take 1 tablet by mouth daily.   albuterol  (VENTOLIN  HFA) 108 (90 Base) MCG/ACT inhaler TAKE 2 PUFFS BY MOUTH 4 TIMES A DAY AS NEEDED (Patient not taking: Reported on 02/08/2024)   aspirin  81 MG chewable tablet Chew 1 tablet (81 mg total) by mouth 2 (two) times daily. (Patient not taking: Reported on 02/08/2024)   furosemide  (LASIX ) 20 MG tablet TAKE 1 TABLET BY MOUTH EVERY DAY (Patient not taking: Reported on  02/08/2024)   guaiFENesin-codeine 100-10 MG/5ML syrup Take 10 mLs by mouth 3 (three) times daily as needed for cough. (Patient not taking: Reported on 02/08/2024)   [DISCONTINUED] methylPREDNISolone  (MEDROL  DOSEPAK) 4 MG TBPK tablet Use as directed (Patient not taking: Reported on 02/08/2024)   [DISCONTINUED] nystatin (MYCOSTATIN/NYSTOP) powder APPLY TO AFFECTED AREA TWICE A DAY (Patient not taking: Reported on 02/08/2024)   No facility-administered medications prior to visit.    Review of Systems  Constitutional: Negative.  Negative for chills, fever and malaise/fatigue.  HENT:  Negative for congestion.   Eyes: Negative.  Negative for blurred vision and pain.  Respiratory:  Positive for cough. Negative for hemoptysis, sputum production and shortness of breath.   Cardiovascular: Negative.  Negative for chest pain, palpitations and leg swelling.  Gastrointestinal: Negative.  Negative for abdominal pain, blood in stool, constipation, diarrhea, heartburn, melena, nausea and vomiting.  Genitourinary:  Positive for dysuria and urgency. Negative for flank pain.  Musculoskeletal: Negative.  Negative for joint pain and myalgias.  Skin: Negative.   Neurological: Negative.  Negative for dizziness, tingling, sensory change, weakness and headaches.  Endo/Heme/Allergies: Negative.   Psychiatric/Behavioral: Negative.  Negative for depression and suicidal  ideas. The patient is not nervous/anxious.        Objective:   BP 122/60   Pulse 87   Ht 5' 6.5 (1.689 m)   Wt 161 lb 9.6 oz (73.3 kg)   SpO2 98%   BMI 25.69 kg/m   Vitals:   02/08/24 1044  BP: 122/60  Pulse: 87  Height: 5' 6.5 (1.689 m)  Weight: 161 lb 9.6 oz (73.3 kg)  SpO2: 98%  BMI (Calculated): 25.7    Physical Exam Vitals and nursing note reviewed. Exam conducted with a chaperone present.  Constitutional:      Appearance: Normal appearance.  HENT:     Head: Normocephalic and atraumatic.     Nose: Nose normal.     Mouth/Throat:     Mouth: Mucous membranes are moist.     Pharynx: Oropharynx is clear.  Eyes:     Conjunctiva/sclera: Conjunctivae normal.     Pupils: Pupils are equal, round, and reactive to light.  Cardiovascular:     Rate and Rhythm: Normal rate and regular rhythm.     Pulses: Normal pulses.     Heart sounds: Normal heart sounds. No murmur heard. Pulmonary:     Effort: Pulmonary effort is normal.     Breath sounds: Normal breath sounds. No wheezing.  Chest:  Breasts:    Right: Normal. No swelling, bleeding, inverted nipple, mass, nipple discharge, skin change or tenderness.     Left: Normal. No swelling, bleeding, inverted nipple, mass, nipple discharge, skin change or tenderness.  Abdominal:     General: Bowel sounds are normal.     Palpations: Abdomen is soft.     Tenderness: There is no abdominal tenderness. There is no right CVA tenderness or left CVA tenderness.     Hernia: There is no hernia in the left inguinal area or right inguinal area.  Genitourinary:    General: Normal vulva.     Pubic Area: No rash or pubic lice.      Labia:        Right: No rash, tenderness, lesion or injury.        Left: No rash, tenderness, lesion or injury.      Urethra: No prolapse.     Vagina: Erythema present. No vaginal discharge, tenderness, bleeding or lesions.  Cervix: No discharge, lesion or cervical bleeding.     Uterus: Normal.       Adnexa: Right adnexa normal and left adnexa normal.  Musculoskeletal:        General: Normal range of motion.     Cervical back: Normal range of motion.     Right lower leg: No edema.     Left lower leg: No edema.  Lymphadenopathy:     Upper Body:     Right upper body: No supraclavicular, axillary or pectoral adenopathy.     Left upper body: No supraclavicular, axillary or pectoral adenopathy.     Lower Body: No right inguinal adenopathy. No left inguinal adenopathy.  Skin:    General: Skin is warm and dry.  Neurological:     General: No focal deficit present.     Mental Status: She is alert and oriented to person, place, and time.  Psychiatric:        Mood and Affect: Mood normal.        Behavior: Behavior normal.      Results for orders placed or performed in visit on 02/08/24  POCT Urinalysis Dipstick (81002)  Result Value Ref Range   Color, UA Yellow    Clarity, UA Cloudy    Glucose, UA Negative Negative   Bilirubin, UA Negative    Ketones, UA Negative    Spec Grav, UA 1.015 1.010 - 1.025   Blood, UA Small    pH, UA 7.0 5.0 - 8.0   Protein, UA Negative Negative   Urobilinogen, UA 0.2 0.2 or 1.0 E.U./dL   Nitrite, UA Positive    Leukocytes, UA Large (3+) (A) Negative   Appearance Cloudy    Odor Yes     Recent Results (from the past 2160 hours)  CBC with Diff     Status: Abnormal   Collection Time: 12/28/23  9:21 AM  Result Value Ref Range   WBC 6.5 3.4 - 10.8 x10E3/uL   RBC 4.67 3.77 - 5.28 x10E6/uL   Hemoglobin 15.1 11.1 - 15.9 g/dL   Hematocrit 54.1 65.9 - 46.6 %   MCV 98 (H) 79 - 97 fL   MCH 32.3 26.6 - 33.0 pg   MCHC 33.0 31.5 - 35.7 g/dL   RDW 86.1 88.2 - 84.5 %   Platelets 256 150 - 450 x10E3/uL   Neutrophils 65 Not Estab. %   Lymphs 24 Not Estab. %   Monocytes 10 Not Estab. %   Eos 1 Not Estab. %   Basos 0 Not Estab. %   Neutrophils Absolute 4.2 1.4 - 7.0 x10E3/uL   Lymphocytes Absolute 1.6 0.7 - 3.1 x10E3/uL   Monocytes Absolute 0.6 0.1 - 0.9  x10E3/uL   EOS (ABSOLUTE) 0.1 0.0 - 0.4 x10E3/uL   Basophils Absolute 0.0 0.0 - 0.2 x10E3/uL   Immature Granulocytes 0 Not Estab. %   Immature Grans (Abs) 0.0 0.0 - 0.1 x10E3/uL  CMP14+EGFR     Status: None   Collection Time: 12/28/23  9:21 AM  Result Value Ref Range   Glucose 97 70 - 99 mg/dL   BUN 11 8 - 27 mg/dL   Creatinine, Ser 9.21 0.57 - 1.00 mg/dL   eGFR 78 >40 fO/fpw/8.26   BUN/Creatinine Ratio 14 12 - 28   Sodium 138 134 - 144 mmol/L   Potassium 3.9 3.5 - 5.2 mmol/L   Chloride 100 96 - 106 mmol/L   CO2 24 20 - 29 mmol/L   Calcium  9.3 8.7 - 10.3  mg/dL   Total Protein 6.4 6.0 - 8.5 g/dL   Albumin 4.4 3.8 - 4.8 g/dL   Globulin, Total 2.0 1.5 - 4.5 g/dL   Bilirubin Total 0.6 0.0 - 1.2 mg/dL   Alkaline Phosphatase 75 44 - 121 IU/L    Comment: **Effective January 10, 2024 Alkaline Phosphatase**   reference interval will be changing to:              Age                Female          Female           0 -  5 days         47 - 127       47 - 127           6 - 10 days         29 - 242       29 - 242          11 - 20 days        109 - 357      109 - 357          21 - 30 days         94 - 494       94 - 494           1 -  2 months      149 - 539      149 - 539           3 -  6 months      131 - 452      131 - 452           7 - 11 months      117 - 401      117 - 401   12 months -  6 years       158 - 369      158 - 369           7 - 12 years       150 - 409      150 - 409               13 years       156 - 435       78 - 227               14 years       114 - 375       64 - 161               15 years        88 - 279       56 - 134               16 years        74 - 207       51 - 121               17 years        63 - 161       47 - 113          18 - 20 years        51 - 125       42 - 106          21 -  50 years         47 - 123       41 - 116          51 - 80 years        49 - 135       51 - 125              >80 years        48 - 129       48 - 129    AST 26 0 - 40 IU/L    ALT 32 0 - 32 IU/L  Lipid Panel w/o Chol/HDL Ratio     Status: None   Collection Time: 12/28/23  9:21 AM  Result Value Ref Range   Cholesterol, Total 128 100 - 199 mg/dL   Triglycerides 868 0 - 149 mg/dL   HDL 43 >60 mg/dL   VLDL Cholesterol Cal 23 5 - 40 mg/dL   LDL Chol Calc (NIH) 62 0 - 99 mg/dL  Hemoglobin J8r     Status: Abnormal   Collection Time: 12/28/23  9:21 AM  Result Value Ref Range   Hgb A1c MFr Bld 6.1 (H) 4.8 - 5.6 %    Comment:          Prediabetes: 5.7 - 6.4          Diabetes: >6.4          Glycemic control for adults with diabetes: <7.0    Est. average glucose Bld gHb Est-mCnc 128 mg/dL  POCT Urinalysis Dipstick (18997)     Status: Abnormal   Collection Time: 02/08/24 11:01 AM  Result Value Ref Range   Color, UA Yellow    Clarity, UA Cloudy    Glucose, UA Negative Negative   Bilirubin, UA Negative    Ketones, UA Negative    Spec Grav, UA 1.015 1.010 - 1.025   Blood, UA Small    pH, UA 7.0 5.0 - 8.0   Protein, UA Negative Negative   Urobilinogen, UA 0.2 0.2 or 1.0 E.U./dL   Nitrite, UA Positive    Leukocytes, UA Large (3+) (A) Negative   Appearance Cloudy    Odor Yes       Assessment & Plan:  Add Augmentin , check urine c/s. ENT referral for Laryngitis and cough - now becoming chronic. Problem List Items Addressed This Visit     Essential hypertension, benign - Primary   Seasonal allergic rhinitis due to pollen   Overactive bladder   Other Visit Diagnoses       Screening for blood or protein in urine       Relevant Orders   POCT Urinalysis Dipstick (18997) (Completed)     Laryngitis       Relevant Medications   amoxicillin-clavulanate (AUGMENTIN) 875-125 MG tablet   Other Relevant Orders   Ambulatory referral to ENT     Acute cystitis without hematuria       Relevant Medications   amoxicillin-clavulanate (AUGMENTIN) 875-125 MG tablet   Other Relevant Orders   Urine Culture     Gastroesophageal reflux disease without esophagitis        Relevant Medications   pantoprazole (PROTONIX) 40 MG tablet     Subacute cough         Subacute vaginitis           Return in about 3 weeks (around 02/29/2024).   Total time spent: 30 minutes  FERNAND FREDY RAMAN, MD  02/08/2024  This document may have been prepared by [employer] Voice Recognition software and as such may include unintentional dictation errors.

## 2024-02-08 NOTE — Addendum Note (Signed)
 Addended byBETHA ORION STABS on: 02/08/2024 02:33 PM   Modules accepted: Orders

## 2024-02-10 LAB — IGP, APTIMA HPV
HPV Aptima: NEGATIVE
PAP Smear Comment: 0

## 2024-02-10 LAB — NUSWAB VAGINITIS PLUS (VG+)
Candida albicans, NAA: NEGATIVE
Candida glabrata, NAA: NEGATIVE
Chlamydia trachomatis, NAA: NEGATIVE
Neisseria gonorrhoeae, NAA: NEGATIVE
Trich vag by NAA: NEGATIVE

## 2024-02-11 LAB — URINE CULTURE

## 2024-02-18 DIAGNOSIS — R490 Dysphonia: Secondary | ICD-10-CM | POA: Diagnosis not present

## 2024-02-18 DIAGNOSIS — J300 Vasomotor rhinitis: Secondary | ICD-10-CM | POA: Diagnosis not present

## 2024-02-29 ENCOUNTER — Ambulatory Visit: Admitting: Internal Medicine

## 2024-02-29 ENCOUNTER — Encounter: Payer: Self-pay | Admitting: Internal Medicine

## 2024-02-29 VITALS — BP 140/60 | HR 99 | Ht 66.5 in | Wt 162.6 lb

## 2024-02-29 DIAGNOSIS — Z23 Encounter for immunization: Secondary | ICD-10-CM

## 2024-02-29 DIAGNOSIS — R7303 Prediabetes: Secondary | ICD-10-CM | POA: Diagnosis not present

## 2024-02-29 DIAGNOSIS — J040 Acute laryngitis: Secondary | ICD-10-CM

## 2024-02-29 DIAGNOSIS — I10 Essential (primary) hypertension: Secondary | ICD-10-CM

## 2024-02-29 DIAGNOSIS — J301 Allergic rhinitis due to pollen: Secondary | ICD-10-CM | POA: Diagnosis not present

## 2024-02-29 NOTE — Progress Notes (Signed)
 Established Patient Office Visit  Subjective:  Patient ID: Jasmine Sullivan, female    DOB: 28-Apr-1945  Age: 78 y.o. MRN: [id number]  Chief Complaint  Patient presents with   Follow-up    3 week follow up    Patient comes in for follow-up today.  Since her last visit she had completed a course of Augmentin and has been evaluated by ENT for her hoarseness and tickle in the throat.  She underwent laryngoscope exam and was reassured that there is no abnormality in her vocal cords.  It is suspected that she her perennial rhinitis is causing most of her problems.  Atrovent nasal spray has been added to her regimen which is helping her.  She was also advised by ENT to resume neti pot treatments. Today patient is feeling well although still has mild tickle in the throat and a cough.  Denies nausea or vomiting, no chest pain no palpitations.  No fevers or chills.  Blood pressure slightly above normal.    No other concerns at this time.   Past Medical History:  Diagnosis Date   Arthritis    Asthma    Complication of anesthesia    Benadryl [Diphenhydramine] recieved after surgery and was difficult to wake up   Dyspnea    DOE   Fatty liver    Glaucoma    Heart murmur    History of methicillin resistant staphylococcus aureus (MRSA) 2000   Hypertension     Past Surgical History:  Procedure Laterality Date   CATARACT EXTRACTION W/PHACO Right 03/23/2017   Procedure: CATARACT EXTRACTION PHACO AND INTRAOCULAR LENS PLACEMENT (IOC);  Surgeon: Jaye Fallow, MD;  Location: [facility] ORS;  Service: Ophthalmology;  Laterality: Right;  US  00:36.7 AP% 13.2 CDE 4.84 Fluid Pack lot # [id number]   COLONOSCOPY N/A 12/21/2023   Procedure: COLONOSCOPY;  Surgeon: Therisa Bi, MD;  Location: [facility] ENDOSCOPY;  Service: Gastroenterology;  Laterality: N/A;   DILATION AND CURETTAGE OF UTERUS     SHOULER Left    rotator cuff   THORACOTOMY     WEDGE RESECTION   TOTAL KNEE ARTHROPLASTY Left 05/09/2019    Procedure: LEFT TOTAL KNEE ARTHROPLASTY;  Surgeon: Kathlynn Sharper, MD;  Location: [facility] ORS;  Service: Orthopedics;  Laterality: Left;   TOTAL KNEE REVISION Left 12/02/2021   Procedure: TOTAL KNEE REVISION;  Surgeon: Leora Lynwood SAUNDERS, MD;  Location: [facility] ORS;  Service: Orthopedics;  Laterality: Left;    Social History   Socioeconomic History   Marital status: Divorced    Spouse name: Not on file   Number of children: Not on file   Years of education: Not on file   Highest education level: Not on file  Occupational History   Not on file  Tobacco Use   Smoking status: Former   Smokeless tobacco: Never   Tobacco comments:    45 years ago  Vaping Use   Vaping status: Never Used  Substance and Sexual Activity   Alcohol  use: Yes    Alcohol /week: 14.0 standard drinks of alcohol     Types: 14 Cans of beer per week   Drug use: Never   Sexual activity: Not on file  Other Topics Concern   Not on file  Social History Narrative   Lives alone   Social Drivers of Health   Financial Resource Strain: Medium Risk (12/15/2023)   Received from [facility]   Overall Financial Resource Strain (CARDIA)    Difficulty of Paying Living Expenses: Somewhat hard  Food Insecurity: No Food Insecurity (12/15/2023)   Received from [facility]   Hunger Vital Sign    Within the past 12 months, you worried that your food would run out before you got the money to buy more.: Never true    Within the past 12 months, the food you bought just didn't last and you didn't have money to get more.: Never true  Transportation Needs: No Transportation Needs (12/15/2023)   Received from [facility] - Transportation    In the past 12 months, has lack of transportation kept you from medical appointments or from getting medications?: No    Lack of Transportation (Non-Medical): No  Physical Activity: Not on file  Stress: Not on file  Social Connections: Not on  file  Intimate Partner Violence: Not on file    Family History  Problem Relation Age of Onset   Breast cancer Mother 49    Allergies  Allergen Reactions   Paroxetine Hcl Itching   Simbrinza [Brinzolamide-Brimonidine] Other (See Comments)    Dry mouth, itching eyes, eczema eyes - tolerates with [place] eye drops   Benadryl [Diphenhydramine]     Glaucoma (caused optic nerve damage)   Chlorhexidine  Gluconate    Crab [Shellfish Allergy] Swelling    And itching   Amlodipine Rash   Benzonatate Rash   Chlorhexidine  Rash   Erythromycin Other (See Comments)    Stomach issues   Nickel Rash    Outpatient Medications Prior to Visit  Medication Sig   Calcium  Carb-Cholecalciferol  (CALCIUM  600+D) 600-800 MG-UNIT TABS Take 1 tablet by mouth daily at 12 noon.    cetirizine  (ZYRTEC ) 10 MG tablet Take 10 mg at bedtime by mouth.   fluticasone (FLONASE) 50 MCG/ACT nasal spray USE 2 SPRAYS IN EACH NOSTRIL EVERY DAY   folic acid (FOLVITE) 1 MG tablet Take 1 mg by mouth daily.   GEMTESA  75 MG TABS TAKE 1 TABLET BY MOUTH EVERY DAY   ipratropium (ATROVENT) 0.03 % nasal spray Place 2 sprays into both nostrils 3 (three) times daily.   methotrexate (RHEUMATREX) 10 MG tablet Take 60 mg by mouth once a week. Caution: Chemotherapy. Protect from light.   montelukast  (SINGULAIR ) 10 MG tablet TAKE 1 TABLET BY MOUTH EVERY DAY   nystatin ointment (MYCOSTATIN) Apply 1 Application topically 2 (two) times daily.   Omega-3 Fatty Acids (FISH OIL) 1000 MG CAPS Take 3,000 mg by mouth daily at 12 noon.    pantoprazole (PROTONIX) 40 MG tablet Take 1 tablet (40 mg total) by mouth daily.   raloxifene  (EVISTA ) 60 MG tablet Take 1 tablet (60 mg total) by mouth daily at 12 noon.   simvastatin  (ZOCOR ) 10 MG tablet TAKE 1 TABLET BY MOUTH EVERY DAY   valsartan -hydrochlorothiazide  (DIOVAN -HCT) 160-25 MG tablet Take 1 tablet by mouth daily.   albuterol  (VENTOLIN  HFA) 108 (90 Base) MCG/ACT inhaler TAKE 2 PUFFS BY MOUTH 4 TIMES A  DAY AS NEEDED (Patient not taking: Reported on 02/29/2024)   amoxicillin-clavulanate (AUGMENTIN) 875-125 MG tablet Take 1 tablet by mouth 2 (two) times daily. (Patient not taking: Reported on 02/29/2024)   aspirin  81 MG chewable tablet Chew 1 tablet (81 mg total) by mouth 2 (two) times daily. (Patient not taking: Reported on 02/29/2024)   furosemide  (LASIX ) 20 MG tablet TAKE 1 TABLET BY MOUTH EVERY DAY (Patient not taking: Reported on 02/29/2024)   guaiFENesin-codeine 100-10 MG/5ML syrup Take 10 mLs by mouth 3 (three) times daily as needed for cough. (  Patient not taking: Reported on 02/29/2024)   No facility-administered medications prior to visit.    Review of Systems  Constitutional: Negative.  Negative for chills, fever and malaise/fatigue.  HENT: Negative.  Negative for congestion and sore throat.   Eyes: Negative.  Negative for blurred vision and pain.  Respiratory: Negative.  Negative for cough and shortness of breath.   Cardiovascular: Negative.  Negative for chest pain, palpitations and leg swelling.  Gastrointestinal: Negative.  Negative for abdominal pain, blood in stool, constipation, diarrhea, heartburn, melena, nausea and vomiting.  Genitourinary: Negative.  Negative for dysuria, flank pain, frequency and urgency.  Musculoskeletal: Negative.  Negative for joint pain and myalgias.  Skin: Negative.   Neurological: Negative.  Negative for dizziness, tingling, sensory change, weakness and headaches.  Endo/Heme/Allergies: Negative.   Psychiatric/Behavioral: Negative.  Negative for depression and suicidal ideas. The patient is not nervous/anxious.        Objective:   BP (!) 140/60   Pulse 99   Ht 5' 6.5 (1.689 m)   Wt 162 lb 9.6 oz (73.8 kg)   SpO2 98%   BMI 25.85 kg/m   Vitals:   02/29/24 1024  BP: (!) 140/60  Pulse: 99  Height: 5' 6.5 (1.689 m)  Weight: 162 lb 9.6 oz (73.8 kg)  SpO2: 98%  BMI (Calculated): 25.85    Physical Exam Vitals and nursing note reviewed.   Constitutional:      Appearance: Normal appearance.  HENT:     Head: Normocephalic and atraumatic.     Nose: Nose normal.     Mouth/Throat:     Mouth: Mucous membranes are moist.     Pharynx: Oropharynx is clear.  Eyes:     Conjunctiva/sclera: Conjunctivae normal.     Pupils: Pupils are equal, round, and reactive to light.  Cardiovascular:     Rate and Rhythm: Normal rate and regular rhythm.     Pulses: Normal pulses.     Heart sounds: Normal heart sounds. No murmur heard. Pulmonary:     Effort: Pulmonary effort is normal.     Breath sounds: Normal breath sounds. No wheezing.  Abdominal:     General: Bowel sounds are normal.     Palpations: Abdomen is soft.     Tenderness: There is no abdominal tenderness. There is no right CVA tenderness or left CVA tenderness.  Musculoskeletal:        General: Normal range of motion.     Cervical back: Normal range of motion.     Right lower leg: No edema.     Left lower leg: No edema.  Skin:    General: Skin is warm and dry.  Neurological:     General: No focal deficit present.     Mental Status: She is alert and oriented to person, place, and time.  Psychiatric:        Mood and Affect: Mood normal.        Behavior: Behavior normal.      No results found for any visits on 02/29/24.  Recent Results (from the past 2160 hours)  CBC with Diff     Status: Abnormal   Collection Time: 12/28/23  9:21 AM  Result Value Ref Range   WBC 6.5 3.4 - 10.8 x10E3/uL   RBC 4.67 3.77 - 5.28 x10E6/uL   Hemoglobin 15.1 11.1 - 15.9 g/dL   Hematocrit 54.1 65.9 - 46.6 %   MCV 98 (H) 79 - 97 fL   MCH 32.3 26.6 - 33.0 pg  MCHC 33.0 31.5 - 35.7 g/dL   RDW 86.1 88.2 - 84.5 %   Platelets 256 150 - 450 x10E3/uL   Neutrophils 65 Not Estab. %   Lymphs 24 Not Estab. %   Monocytes 10 Not Estab. %   Eos 1 Not Estab. %   Basos 0 Not Estab. %   Neutrophils Absolute 4.2 1.4 - 7.0 x10E3/uL   Lymphocytes Absolute 1.6 0.7 - 3.1 x10E3/uL   Monocytes Absolute  0.6 0.1 - 0.9 x10E3/uL   EOS (ABSOLUTE) 0.1 0.0 - 0.4 x10E3/uL   Basophils Absolute 0.0 0.0 - 0.2 x10E3/uL   Immature Granulocytes 0 Not Estab. %   Immature Grans (Abs) 0.0 0.0 - 0.1 x10E3/uL  CMP14+EGFR     Status: None   Collection Time: 12/28/23  9:21 AM  Result Value Ref Range   Glucose 97 70 - 99 mg/dL   BUN 11 8 - 27 mg/dL   Creatinine, Ser 9.21 0.57 - 1.00 mg/dL   eGFR 78 >40 fO/fpw/8.26   BUN/Creatinine Ratio 14 12 - 28   Sodium 138 134 - 144 mmol/L   Potassium 3.9 3.5 - 5.2 mmol/L   Chloride 100 96 - 106 mmol/L   CO2 24 20 - 29 mmol/L   Calcium  9.3 8.7 - 10.3 mg/dL   Total Protein 6.4 6.0 - 8.5 g/dL   Albumin 4.4 3.8 - 4.8 g/dL   Globulin, Total 2.0 1.5 - 4.5 g/dL   Bilirubin Total 0.6 0.0 - 1.2 mg/dL   Alkaline Phosphatase 75 44 - 121 IU/L    Comment: **Effective January 10, 2024 Alkaline Phosphatase**   reference interval will be changing to:              Age                Female          Female           0 -  5 days         47 - 127       47 - 127           6 - 10 days         29 - 242       29 - 242          11 - 20 days        109 - 357      109 - 357          21 - 30 days         94 - 494       94 - 494           1 -  2 months      149 - 539      149 - 539           3 -  6 months      131 - 452      131 - 452           7 - 11 months      117 - 401      117 - 401   12 months -  6 years       158 - 369      158 - 369           7 - 12 years       150 - 409  150 - 409               13 years       156 - 435       78 - 227               14 years       114 - 375       64 - 161               15 years        88 - 279       56 - 134               16 years        74 - 207       51 - 121               17 years        63 - 161       47 - 113          18 - 20 years        51 - 125       42 - 106          21 - 50 years         47 - 123       41 - 116          51 - 80 years        49 - 135       51 - 125              >80 years        48 - 129       48 - 129    AST 26  0 - 40 IU/L   ALT 32 0 - 32 IU/L  Lipid Panel w/o Chol/HDL Ratio     Status: None   Collection Time: 12/28/23  9:21 AM  Result Value Ref Range   Cholesterol, Total 128 100 - 199 mg/dL   Triglycerides 868 0 - 149 mg/dL   HDL 43 >60 mg/dL   VLDL Cholesterol Cal 23 5 - 40 mg/dL   LDL Chol Calc (NIH) 62 0 - 99 mg/dL  Hemoglobin J8r     Status: Abnormal   Collection Time: 12/28/23  9:21 AM  Result Value Ref Range   Hgb A1c MFr Bld 6.1 (H) 4.8 - 5.6 %    Comment:          Prediabetes: 5.7 - 6.4          Diabetes: >6.4          Glycemic control for adults with diabetes: <7.0    Est. average glucose Bld gHb Est-mCnc 128 mg/dL  POCT Urinalysis Dipstick (18997)     Status: Abnormal   Collection Time: 02/08/24 11:01 AM  Result Value Ref Range   Color, UA Yellow    Clarity, UA Cloudy    Glucose, UA Negative Negative   Bilirubin, UA Negative    Ketones, UA Negative    Spec Grav, UA 1.015 1.010 - 1.025   Blood, UA Small    pH, UA 7.0 5.0 - 8.0   Protein, UA Negative Negative   Urobilinogen, UA 0.2 0.2 or 1.0 E.U./dL   Nitrite, UA Positive    Leukocytes, UA Large (3+) (A) Negative   Appearance Cloudy    Odor Yes   IGP,  Aptima HPV     Status: None   Collection Time: 02/08/24  3:37 PM  Result Value Ref Range   DIAGNOSIS: Comment     Comment: NEGATIVE FOR INTRAEPITHELIAL LESION OR MALIGNANCY.   Specimen adequacy: Comment     Comment: Satisfactory for evaluation. Endocervical and/or squamous metaplastic cells (endocervical component) are present.    Clinician Provided ICD10 Comment     Comment: Z12.72   Performed by: Comment     Comment: Alphine Simmons, Cytologist (ASCP)   PAP Smear Comment .    Note: Comment     Comment: The Pap smear is a screening test designed to aid in the detection of premalignant and malignant conditions of the uterine cervix.  It is not a diagnostic procedure and should not be used as the sole means of detecting cervical cancer.  Both false-positive and  false-negative reports do occur.    Test Methodology Comment     Comment: This liquid based ThinPrep(R) pap test was interpreted using the Hologic(R) Genius(TM) Cervical Algorithm whole slide imaging system.    HPV Aptima Negative Negative    Comment: This nucleic acid amplification test detects fourteen high-risk HPV types (16,18,31,33,35,39,45,51,52,56,58,59,66,68) without differentiation.   NuSwab Vaginitis Plus (VG+)     Status: None   Collection Time: 02/08/24  3:39 PM  Result Value Ref Range   Atopobium vaginae Low - 0 Score   BVAB 2 Low - 0 Score   Megasphaera 1 Low - 0 Score    Comment: Calculate total score by adding the 3 individual bacterial vaginosis (BV) marker scores together.  Total score is interpreted as follows: Total score 0-1: Indicates the absence of BV. Total score   2: Indeterminate for BV. Additional clinical                  data should be evaluated to establish a                  diagnosis. Total score 3-6: Indicates the presence of BV.    Candida albicans, NAA Negative Negative   Candida glabrata, NAA Negative Negative   Trich vag by NAA Negative Negative   Chlamydia trachomatis, NAA Negative Negative   Neisseria gonorrhoeae, NAA Negative Negative  Urine Culture     Status: Abnormal   Collection Time: 02/08/24  3:42 PM   Specimen: Urine   UR  Result Value Ref Range   Urine Culture, Routine Final report (A)    Organism ID, Bacteria Escherichia coli (A)     Comment: Cefazolin  with an MIC <=16 predicts susceptibility to the oral agents cefaclor, cefdinir, cefpodoxime, cefprozil, cefuroxime, cephalexin, and loracarbef when used for therapy of uncomplicated urinary tract infections due to E. coli, Klebsiella pneumoniae, and Proteus mirabilis. Greater than 100,000 colony forming units per mL    Antimicrobial Susceptibility Comment     Comment:       ** S = Susceptible; I = Intermediate; R = Resistant **                    P = Positive; N =  Negative             MICS are expressed in micrograms per mL    Antibiotic                 RSLT#1    RSLT#2    RSLT#3    RSLT#4 Amoxicillin/Clavulanic Acid    S Ampicillin  S Cefazolin                       S Cefepime                       S Cefoxitin                      S Cefpodoxime                    S Ceftriaxone                    S Ciprofloxacin                  S Ertapenem                      S Gentamicin                     S Levofloxacin                   S Meropenem                      S Nitrofurantoin                 S Piperacillin/Tazobactam        S Tetracycline                   S Tobramycin                     S Trimethoprim/Sulfa             S       Assessment & Plan:  Continue current medications.  Flu shot today.  Patient will return for AWV in 1 month. Problem List Items Addressed This Visit     Essential hypertension, benign - Primary   Seasonal allergic rhinitis due to pollen   Other Visit Diagnoses       Needs flu shot       Relevant Orders   Flu Vaccine Trivalent High Dose (Fluad) (Completed)     Laryngitis         Prediabetes           Return in about 1 month (around 03/30/2024).   Total time spent: 25 minutes. This time includes review of previous notes and results and patient face to face interaction during today's visit.    FERNAND FREDY RAMAN, MD  02/29/2024   This document may have been prepared by [facility] Voice Recognition software and as such may include unintentional dictation errors.

## 2024-03-01 ENCOUNTER — Other Ambulatory Visit: Payer: Self-pay | Admitting: Internal Medicine

## 2024-03-01 DIAGNOSIS — K219 Gastro-esophageal reflux disease without esophagitis: Secondary | ICD-10-CM

## 2024-03-09 DIAGNOSIS — H269 Unspecified cataract: Secondary | ICD-10-CM | POA: Diagnosis not present

## 2024-03-09 DIAGNOSIS — H401232 Low-tension glaucoma, bilateral, moderate stage: Secondary | ICD-10-CM | POA: Diagnosis not present

## 2024-03-22 ENCOUNTER — Other Ambulatory Visit: Payer: Self-pay | Admitting: Internal Medicine

## 2024-03-22 DIAGNOSIS — E782 Mixed hyperlipidemia: Secondary | ICD-10-CM

## 2024-03-30 ENCOUNTER — Ambulatory Visit: Admitting: Internal Medicine

## 2024-03-30 DIAGNOSIS — H269 Unspecified cataract: Secondary | ICD-10-CM | POA: Diagnosis not present

## 2024-03-30 DIAGNOSIS — H401232 Low-tension glaucoma, bilateral, moderate stage: Secondary | ICD-10-CM | POA: Diagnosis not present

## 2024-03-31 ENCOUNTER — Other Ambulatory Visit: Payer: Self-pay | Admitting: Internal Medicine

## 2024-03-31 DIAGNOSIS — I10 Essential (primary) hypertension: Secondary | ICD-10-CM

## 2024-03-31 DIAGNOSIS — R053 Chronic cough: Secondary | ICD-10-CM | POA: Diagnosis not present

## 2024-04-07 ENCOUNTER — Encounter: Payer: Self-pay | Admitting: Internal Medicine

## 2024-04-07 ENCOUNTER — Ambulatory Visit: Admitting: Internal Medicine

## 2024-04-07 VITALS — BP 140/64 | HR 91 | Ht 66.5 in | Wt 160.6 lb

## 2024-04-07 DIAGNOSIS — Z0001 Encounter for general adult medical examination with abnormal findings: Secondary | ICD-10-CM | POA: Diagnosis not present

## 2024-04-07 DIAGNOSIS — Z6825 Body mass index (BMI) 25.0-25.9, adult: Secondary | ICD-10-CM | POA: Diagnosis not present

## 2024-04-07 DIAGNOSIS — E782 Mixed hyperlipidemia: Secondary | ICD-10-CM

## 2024-04-07 DIAGNOSIS — R7302 Impaired glucose tolerance (oral): Secondary | ICD-10-CM | POA: Diagnosis not present

## 2024-04-07 DIAGNOSIS — J4531 Mild persistent asthma with (acute) exacerbation: Secondary | ICD-10-CM | POA: Insufficient documentation

## 2024-04-07 DIAGNOSIS — J301 Allergic rhinitis due to pollen: Secondary | ICD-10-CM | POA: Diagnosis not present

## 2024-04-07 DIAGNOSIS — I10 Essential (primary) hypertension: Secondary | ICD-10-CM

## 2024-04-07 DIAGNOSIS — R053 Chronic cough: Secondary | ICD-10-CM

## 2024-04-07 DIAGNOSIS — Z13820 Encounter for screening for osteoporosis: Secondary | ICD-10-CM | POA: Diagnosis not present

## 2024-04-07 DIAGNOSIS — E663 Overweight: Secondary | ICD-10-CM | POA: Diagnosis not present

## 2024-04-07 DIAGNOSIS — Z0000 Encounter for general adult medical examination without abnormal findings: Secondary | ICD-10-CM | POA: Insufficient documentation

## 2024-04-07 MED ORDER — BUDESONIDE-FORMOTEROL FUMARATE 80-4.5 MCG/ACT IN AERO: 2.0000 | INHALATION_SPRAY | Freq: Two times a day (BID) | RESPIRATORY_TRACT | 12 refills | Status: AC

## 2024-04-07 MED ORDER — ALBUTEROL SULFATE HFA 108 (90 BASE) MCG/ACT IN AERS: 1.0000 | INHALATION_SPRAY | RESPIRATORY_TRACT | 0 refills | Status: AC | PRN

## 2024-04-07 MED ORDER — VALSARTAN-HYDROCHLOROTHIAZIDE 320-25 MG PO TABS: 1.0000 | ORAL_TABLET | Freq: Every day | ORAL | 1 refills | Status: DC

## 2024-04-07 NOTE — Progress Notes (Signed)
 Established Patient Office Visit  Subjective:  Patient ID: Jasmine Sullivan, female    DOB: 08-12-1945  Age: 78 y.o. MRN: [id number]  Chief Complaint  Patient presents with   Annual Exam    AWV    Patient is here today for follow up. She reports going to urgent care recently for continued URI with barking cough that began 2 months ago. She was given steroid shot and she has started to feel better. She has now had 2 courses of oral steroids and antibiotics in addition the the steroid shot. She needs refill on her Albuterol  inhaler as she does not have remaining refills. Will start Symbicort 2 puffs twice day in addition to current treatment plan.  She has productive cough today. She has been able to sleep and has not been having coughing fits at night. She was given codeine  cough syrup and has finished that. Denies fever and chills. Recommend starting Mucinex  and drinking plenty of water daily. Continue using allergy medication daily. Can switch to Allegra and Nasacort or Astepro nasal spray. Recommend saline nose spray to help with dryness or using humidifier at bedtime to provide moisture to the air.  Patient used to see Pulmonology and has hx of benign lobe resection approximately 10 years ago from patients report. Will order Chest CT. Xray 03/31/24 at Urgent care showed no acute findings. She reports she does not want to go to Pulmonology at this time but will consider going in the future if asthma remains persistent. Denies any chest pain, shortness of breath, headaches, dizziness, palpitations at this time.  Patients BP is elevated today and she reports getting elevated BP readings at home >130/80. Will increase BP medication today. Patient will return for fasting labs.  PHQ-9 score 0; GAD-7 score; 6CIT score 2. Mammogram was completed 06/2023 Colonoscopy completed 11/2023    No other concerns at this time.   Past Medical History:  Diagnosis Date   Arthritis    Asthma     Complication of anesthesia    Benadryl [Diphenhydramine] recieved after surgery and was difficult to wake up   Dyspnea    DOE   Fatty liver    Glaucoma    Heart murmur    History of methicillin resistant staphylococcus aureus (MRSA) 2000   Hypertension     Past Surgical History:  Procedure Laterality Date   CATARACT EXTRACTION W/PHACO Right 03/23/2017   Procedure: CATARACT EXTRACTION PHACO AND INTRAOCULAR LENS PLACEMENT (IOC);  Surgeon: Jaye Fallow, MD;  Location: [facility] ORS;  Service: Ophthalmology;  Laterality: Right;  US  00:36.7 AP% 13.2 CDE 4.84 Fluid Pack lot # [id number]   COLONOSCOPY N/A 12/21/2023   Procedure: COLONOSCOPY;  Surgeon: Therisa Bi, MD;  Location: [facility] ENDOSCOPY;  Service: Gastroenterology;  Laterality: N/A;   DILATION AND CURETTAGE OF UTERUS     SHOULER Left    rotator cuff   THORACOTOMY     WEDGE RESECTION   TOTAL KNEE ARTHROPLASTY Left 05/09/2019   Procedure: LEFT TOTAL KNEE ARTHROPLASTY;  Surgeon: Kathlynn Sharper, MD;  Location: [facility] ORS;  Service: Orthopedics;  Laterality: Left;   TOTAL KNEE REVISION Left 12/02/2021   Procedure: TOTAL KNEE REVISION;  Surgeon: Leora Lynwood SAUNDERS, MD;  Location: [facility] ORS;  Service: Orthopedics;  Laterality: Left;    Social History   Socioeconomic History   Marital status: Divorced    Spouse name: Not on file   Number of children: Not on file   Years of education: Not on file   Highest  education level: Not on file  Occupational History   Not on file  Tobacco Use   Smoking status: Former   Smokeless tobacco: Never   Tobacco comments:    45 years ago  Vaping Use   Vaping status: Never Used  Substance and Sexual Activity   Alcohol  use: Yes    Alcohol /week: 14.0 standard drinks of alcohol     Types: 14 Cans of beer per week   Drug use: Never   Sexual activity: Not on file  Other Topics Concern   Not on file  Social History Narrative   Lives alone   Social Drivers of Health   Tobacco Use: Medium Risk  (04/07/2024)   Patient History    Smoking Tobacco Use: Former    Smokeless Tobacco Use: Never    Passive Exposure: Not on file  Financial Resource Strain: Medium Risk (03/31/2024)   Received from [facility]   Overall Financial Resource Strain (CARDIA)    Difficulty of Paying Living Expenses: Somewhat hard  Food Insecurity: Food Insecurity Present (03/31/2024)   Received from [facility]   Epic    Within the past 12 months, you worried that your food would run out before you got the money to buy more.: Sometimes true    Within the past 12 months, the food you bought just didn't last and you didn't have money to get more.: Never true  Transportation Needs: No Transportation Needs (03/31/2024)   Received from [facility] - Transportation    In the past 12 months, has lack of transportation kept you from medical appointments or from getting medications?: No    Lack of Transportation (Non-Medical): No  Physical Activity: Not on file  Stress: Not on file  Social Connections: Not on file  Intimate Partner Violence: Not on file  Depression (PHQ2-9): Low Risk (07/05/2023)   Depression (PHQ2-9)    PHQ-2 Score: 0  Alcohol  Screen: Not on file  Housing: Low Risk  (03/31/2024)   [facility]   Epic    In the last 12 months, was there a time when you were not able to pay the mortgage or rent on time?: No    In the past 12 months, how many times have you moved where you were living?: 0    At any time in the past 12 months, were you homeless or living in a shelter (including now)?: No  Utilities: Not At Risk (03/31/2024)   Received from [facility]   Epic    In the past 12 months has the electric, gas, oil, or water company threatened to shut off services in your home?: No  Health Literacy: Not on file    Family History  Problem Relation Age of Onset   Breast cancer Mother 35     Allergies[1]  Show/hide medication list[2]  Review of Systems  Constitutional: Negative.  Negative for chills, fever and malaise/fatigue.  HENT: Negative.  Negative for congestion and sore throat.   Eyes: Negative.  Negative for blurred vision and pain.  Respiratory: Negative.  Negative for cough and shortness of breath.   Cardiovascular: Negative.  Negative for chest pain, palpitations and leg swelling.  Gastrointestinal: Negative.  Negative for abdominal pain, blood in stool, constipation, diarrhea, heartburn, melena, nausea and vomiting.  Genitourinary: Negative.  Negative for dysuria, flank pain, frequency and urgency.  Musculoskeletal: Negative.  Negative for joint pain and myalgias.  Skin: Negative.  Neurological: Negative.  Negative for dizziness, tingling, sensory change, weakness and headaches.  Endo/Heme/Allergies: Negative.   Psychiatric/Behavioral: Negative.  Negative for depression and suicidal ideas. The patient is not nervous/anxious.        Objective:   BP (!) 140/64   Pulse 91   Ht 5' 6.5 (1.689 m)   Wt 160 lb 9.6 oz (72.8 kg)   SpO2 96%   BMI 25.53 kg/m   Vitals:   04/07/24 1424  BP: (!) 140/64  Pulse: 91  Height: 5' 6.5 (1.689 m)  Weight: 160 lb 9.6 oz (72.8 kg)  SpO2: 96%  BMI (Calculated): 25.54    Physical Exam Vitals and nursing note reviewed.  Constitutional:      Appearance: Normal appearance.  HENT:     Head: Normocephalic and atraumatic.     Nose: Nose normal.     Mouth/Throat:     Mouth: Mucous membranes are moist.     Pharynx: Oropharynx is clear.  Eyes:     Conjunctiva/sclera: Conjunctivae normal.     Pupils: Pupils are equal, round, and reactive to light.  Cardiovascular:     Rate and Rhythm: Normal rate and regular rhythm.     Pulses: Normal pulses.     Heart sounds: Murmur heard.  Pulmonary:     Effort: Pulmonary effort is normal.     Breath sounds: Normal breath sounds. No wheezing.  Abdominal:     General: Bowel  sounds are normal.     Palpations: Abdomen is soft.     Tenderness: There is no abdominal tenderness. There is no right CVA tenderness or left CVA tenderness.  Musculoskeletal:        General: Normal range of motion.     Cervical back: Normal range of motion.     Right lower leg: No edema.     Left lower leg: No edema.  Skin:    General: Skin is warm and dry.  Neurological:     General: No focal deficit present.     Mental Status: She is alert and oriented to person, place, and time.  Psychiatric:        Mood and Affect: Mood normal.        Behavior: Behavior normal.      No results found for any visits on 04/07/24.  Recent Results (from the past 2160 hours)  POCT Urinalysis Dipstick (18997)     Status: Abnormal   Collection Time: 02/08/24 11:01 AM  Result Value Ref Range   Color, UA Yellow    Clarity, UA Cloudy    Glucose, UA Negative Negative   Bilirubin, UA Negative    Ketones, UA Negative    Spec Grav, UA 1.015 1.010 - 1.025   Blood, UA Small    pH, UA 7.0 5.0 - 8.0   Protein, UA Negative Negative   Urobilinogen, UA 0.2 0.2 or 1.0 E.U./dL   Nitrite, UA Positive    Leukocytes, UA Large (3+) (A) Negative   Appearance Cloudy    Odor Yes   IGP, Aptima HPV     Status: None   Collection Time: 02/08/24  3:37 PM  Result Value Ref Range   DIAGNOSIS: Comment     Comment: NEGATIVE FOR INTRAEPITHELIAL LESION OR MALIGNANCY.   Specimen adequacy: Comment     Comment: Satisfactory for evaluation. Endocervical and/or squamous metaplastic cells (endocervical component) are present.    Clinician Provided ICD10 Comment     Comment: Z12.72   Performed by: Comment  Comment: Alphine Simmons, Cytologist (ASCP)   PAP Smear Comment .    Note: Comment     Comment: The Pap smear is a screening test designed to aid in the detection of premalignant and malignant conditions of the uterine cervix.  It is not a diagnostic procedure and should not be used as the sole means of  detecting cervical cancer.  Both false-positive and false-negative reports do occur.    Test Methodology Comment     Comment: This liquid based ThinPrep(R) pap test was interpreted using the Hologic(R) Genius(TM) Cervical Algorithm whole slide imaging system.    HPV Aptima Negative Negative    Comment: This nucleic acid amplification test detects fourteen high-risk HPV types (16,18,31,33,35,39,45,51,52,56,58,59,66,68) without differentiation.   NuSwab Vaginitis Plus (VG+)     Status: None   Collection Time: 02/08/24  3:39 PM  Result Value Ref Range   Atopobium vaginae Low - 0 Score   BVAB 2 Low - 0 Score   Megasphaera 1 Low - 0 Score    Comment: Calculate total score by adding the 3 individual bacterial vaginosis (BV) marker scores together.  Total score is interpreted as follows: Total score 0-1: Indicates the absence of BV. Total score   2: Indeterminate for BV. Additional clinical                  data should be evaluated to establish a                  diagnosis. Total score 3-6: Indicates the presence of BV.    Candida albicans, NAA Negative Negative   Candida glabrata, NAA Negative Negative   Trich vag by NAA Negative Negative   Chlamydia trachomatis, NAA Negative Negative   Neisseria gonorrhoeae, NAA Negative Negative  Urine Culture     Status: Abnormal   Collection Time: 02/08/24  3:42 PM   Specimen: Urine   UR  Result Value Ref Range   Urine Culture, Routine Final report (A)    Organism ID, Bacteria Escherichia coli (A)     Comment: Cefazolin  with an MIC <=16 predicts susceptibility to the oral agents cefaclor, cefdinir, cefpodoxime, cefprozil, cefuroxime, cephalexin, and loracarbef when used for therapy of uncomplicated urinary tract infections due to E. coli, Klebsiella pneumoniae, and Proteus mirabilis. Greater than 100,000 colony forming units per mL    Antimicrobial Susceptibility Comment     Comment:       ** S = Susceptible; I = Intermediate; R =  Resistant **                    P = Positive; N = Negative             MICS are expressed in micrograms per mL    Antibiotic                 RSLT#1    RSLT#2    RSLT#3    RSLT#4 Amoxicillin /Clavulanic Acid    S Ampicillin                     S Cefazolin                       S Cefepime                       S Cefoxitin  S Cefpodoxime                    S Ceftriaxone                    S Ciprofloxacin                   S Ertapenem                      S Gentamicin                     S Levofloxacin                   S Meropenem                      S Nitrofurantoin                 S Piperacillin/Tazobactam        S Tetracycline                   S Tobramycin                     S Trimethoprim/Sulfa             S       Assessment & Plan:  Start Symbicort inhaler twice daily. Increase BP medication. Refill sent. Recommend switch allergy medications and add saline nasal spray daily. Reinforced avoiding allergy triggers as they can make asthma symptoms worse. Patient to return for fasting blood work. Order Chest CT. Discussed pulmonology referral. DEXA scan ordered. Problem List Items Addressed This Visit       Cardiovascular and Mediastinum   Essential hypertension, benign   Relevant Medications   valsartan -hydrochlorothiazide  (DIOVAN -HCT) 320-25 MG tablet   Other Relevant Orders   CMP14+EGFR   CBC with Diff     Respiratory   Seasonal allergic rhinitis due to pollen   Mild persistent asthma with exacerbation   Relevant Medications   albuterol  (VENTOLIN  HFA) 108 (90 Base) MCG/ACT inhaler   budesonide-formoterol (SYMBICORT) 80-4.5 MCG/ACT inhaler   Other Relevant Orders   CT Chest Wo Contrast     Endocrine   Impaired glucose tolerance   Relevant Orders   Hemoglobin A1c     Other   Hyperlipidemia   Relevant Medications   valsartan -hydrochlorothiazide  (DIOVAN -HCT) 320-25 MG tablet   Other Relevant Orders   Lipid Panel w/o Chol/HDL Ratio   Chronic  cough   Relevant Orders   CT Chest Wo Contrast   Screening for osteoporosis   Relevant Orders   DG Bone Density   Medicare annual wellness visit, subsequent - Primary   Overweight with body mass index (BMI) of 25 to 25.9 in adult    Return in about 4 weeks (around 05/05/2024).   Total time spent: 30 minutes. This time includes review of previous notes and results and patient face to face interaction during today's visit.    FERNAND FREDY RAMAN, MD  04/07/2024   This document may have been prepared by [facility] Voice Recognition software and as such may include unintentional dictation errors.     [1]  Allergies Allergen Reactions   Paroxetine Hcl Itching   Simbrinza [Brinzolamide-Brimonidine] Other (See Comments)    Dry mouth, itching eyes, eczema eyes - tolerates with [place] eye drops   Benadryl [Diphenhydramine]     Glaucoma (caused optic nerve damage)  Chlorhexidine  Gluconate    Crab [Shellfish Allergy] Swelling    And itching   Amlodipine Rash   Benzonatate Rash   Chlorhexidine  Rash   Erythromycin Other (See Comments)    Stomach issues   Nickel Rash  [2]  Outpatient Medications Prior to Visit  Medication Sig   Calcium  Carb-Cholecalciferol  (CALCIUM  600+D) 600-800 MG-UNIT TABS Take 1 tablet by mouth daily at 12 noon.    cetirizine  (ZYRTEC ) 10 MG tablet Take 10 mg at bedtime by mouth.   fluticasone (FLONASE) 50 MCG/ACT nasal spray USE 2 SPRAYS IN EACH NOSTRIL EVERY DAY   folic acid (FOLVITE) 1 MG tablet Take 1 mg by mouth daily.   GEMTESA  75 MG TABS TAKE 1 TABLET BY MOUTH EVERY DAY   methotrexate (RHEUMATREX) 10 MG tablet Take 60 mg by mouth once a week. Caution: Chemotherapy. Protect from light.   montelukast  (SINGULAIR ) 10 MG tablet TAKE 1 TABLET BY MOUTH EVERY DAY   moxifloxacin  (AVELOX ) 400 MG tablet Take 400 mg by mouth.   nystatin  ointment (MYCOSTATIN ) Apply 1 Application topically 2 (two) times daily.   Omega-3 Fatty Acids (FISH OIL) 1000 MG CAPS Take 3,000 mg by  mouth daily at 12 noon.    promethazine -dextromethorphan (PROMETHAZINE -DM) 6.25-15 MG/5ML syrup Take 5 mLs by mouth.   raloxifene  (EVISTA ) 60 MG tablet Take 1 tablet (60 mg total) by mouth daily at 12 noon.   simvastatin  (ZOCOR ) 10 MG tablet TAKE 1 TABLET BY MOUTH EVERY DAY   [DISCONTINUED] valsartan -hydrochlorothiazide  (DIOVAN -HCT) 160-25 MG tablet TAKE 1 TABLET BY MOUTH EVERY DAY   aspirin  81 MG chewable tablet Chew 1 tablet (81 mg total) by mouth 2 (two) times daily. (Patient not taking: Reported on 04/07/2024)   [DISCONTINUED] albuterol  (VENTOLIN  HFA) 108 (90 Base) MCG/ACT inhaler TAKE 2 PUFFS BY MOUTH 4 TIMES A DAY AS NEEDED (Patient not taking: Reported on 04/07/2024)   [DISCONTINUED] amoxicillin -clavulanate (AUGMENTIN ) 875-125 MG tablet Take 1 tablet by mouth 2 (two) times daily. (Patient not taking: Reported on 04/07/2024)   [DISCONTINUED] furosemide  (LASIX ) 20 MG tablet TAKE 1 TABLET BY MOUTH EVERY DAY (Patient not taking: Reported on 04/07/2024)   [DISCONTINUED] guaiFENesin -codeine  100-10 MG/5ML syrup Take 10 mLs by mouth 3 (three) times daily as needed for cough. (Patient not taking: Reported on 04/07/2024)   [DISCONTINUED] ipratropium (ATROVENT) 0.03 % nasal spray Place 2 sprays into both nostrils 3 (three) times daily. (Patient not taking: Reported on 04/07/2024)   [DISCONTINUED] pantoprazole  (PROTONIX ) 40 MG tablet TAKE 1 TABLET BY MOUTH EVERY DAY (Patient not taking: Reported on 04/07/2024)   No facility-administered medications prior to visit.

## 2024-04-30 ENCOUNTER — Other Ambulatory Visit: Payer: Self-pay | Admitting: Internal Medicine

## 2024-04-30 DIAGNOSIS — I10 Essential (primary) hypertension: Secondary | ICD-10-CM

## 2024-05-05 ENCOUNTER — Ambulatory Visit: Admitting: Internal Medicine

## 2024-06-02 ENCOUNTER — Other Ambulatory Visit: Payer: Self-pay | Admitting: Internal Medicine

## 2024-06-13 ENCOUNTER — Ambulatory Visit: Admit: 2024-06-13 | Admitting: Ophthalmology
